# Patient Record
Sex: Female | Born: 2000 | Race: Asian | Hispanic: No | Marital: Single | Smoking: Never smoker
Health system: Southern US, Community
[De-identification: ages and names within clinical notes are randomized; demographics above are authoritative.]

## PROBLEM LIST (undated history)

## (undated) DIAGNOSIS — R51 Headache: Secondary | ICD-10-CM

## (undated) HISTORY — PX: ELBOW ARTHROSCOPY: SUR87

## (undated) HISTORY — DX: Headache: R51

---
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
## 2022-02-01 ENCOUNTER — Ambulatory Visit (INDEPENDENT_AMBULATORY_CARE_PROVIDER_SITE_OTHER): Payer: Medicaid Other | Admitting: Nurse Practitioner

## 2022-02-01 ENCOUNTER — Encounter (HOSPITAL_BASED_OUTPATIENT_CLINIC_OR_DEPARTMENT_OTHER): Payer: Self-pay | Admitting: Nurse Practitioner

## 2022-02-01 VITALS — BP 88/58 | HR 73 | Ht 59.0 in | Wt 98.5 lb

## 2022-02-01 DIAGNOSIS — N281 Cyst of kidney, acquired: Secondary | ICD-10-CM | POA: Diagnosis not present

## 2022-02-01 DIAGNOSIS — E559 Vitamin D deficiency, unspecified: Secondary | ICD-10-CM

## 2022-02-01 DIAGNOSIS — R109 Unspecified abdominal pain: Secondary | ICD-10-CM | POA: Diagnosis not present

## 2022-02-01 NOTE — Progress Notes (Signed)
Orma Render, DNP, AGNP-c Primary Care & Sports Medicine [address]  [place], [place] [address] [phone]  New patient visit   Patient: Alicia Bernard   DOB: [birth year]   21 y.o. Female  MRN: [id number] Visit Date: 02/01/2022  Patient Care Team: Sher Hellinger, Coralee Pesa, NP as PCP - General (Nurse Practitioner) Mir, Gwendolyn Lima, MD as Consulting Physician (Pediatric Gastroenterology)  Today's Vitals   02/01/22 0950  BP: (!) 88/58  Pulse: 73  SpO2: 96%  Weight: 98 lb 8 oz (44.7 kg)  Height: $Remove'4\' 11"'qKrLNxI$  (1.499 m)   Body mass index is 19.89 kg/m.   Today's healthcare provider: Orma Render, NP   Chief Complaint  Patient presents with   New Patient (Initial Visit)    Patient presents today to establish care.   Subjective    Alicia Bernard is a 21 y.o. female who presents today as a new patient to establish care.    Patient endorses the following concerns presently:  Abdominal Pain - This started around the beginning of July.  - She denies fevers, chills, dysuria, loose stools.  - She was seen in the ED and had labs done. She was positive for UTI, but this has been treated - CT was done - She endorses sharp pain in the right abdomen midway between upper and lower quadrant.  - Will lasts for 3 days, in the past would last for a week.  - She tells me the pain comes and goes without any correlation to food or activity.  - She cant think of anything that makes it feel better and nothing that makes it feel worse.  - She tells me that sometimes she feels the pain in her back on the right side.  - She will often feel sick to her stomach like she might throw up.  - She tells me that the first time she had this happen she did not have her period that month.  - She tells me that she did have a day of heavy bleeding last month that lasted for only one day and then stopped.  - She said it was not like a normal period.  - She last had sex in June.  History reviewed  and reveals the following: Past Medical History:  Diagnosis Date   Headache(784.0)    Past Surgical History:  Procedure Laterality Date   ELBOW ARTHROSCOPY     age 54 left elbow surgery from a break   Family Status  Relation Name Status   Mother  Deceased at age 71       Lupus   Father  59   Brother  Alive       Younger   MGM  Alive   MGF  Alive   [place]  Alive   PGF  Alive   Neg Hx  (Not Specified)   Family History  Problem Relation Age of Onset   Lupus Mother    Seizures Mother    Stroke Mother    Hypertension Father    ADD / ADHD Brother    Lung cancer Paternal Grandmother    Irritable bowel syndrome Neg Hx    Food intolerance Neg Hx    GI problems Neg Hx    Social History   Socioeconomic History   Marital status: Single    Spouse name: Not on file   Number of children: Not on file   Years of education: Not on file   Highest education  level: Not on file  Occupational History   Not on file  Tobacco Use   Smoking status: Never   Smokeless tobacco: Never  Substance and Sexual Activity   Alcohol use: Not on file   Drug use: Not on file   Sexual activity: Not on file  Other Topics Concern   Not on file  Social History Narrative   Lives with dad and younger brother. Attends [employer] and is in the 10th grade   Social Determinants of Health   Financial Resource Strain: Not on file  Food Insecurity: Not on file  Transportation Needs: Not on file  Physical Activity: Not on file  Stress: Not on file  Social Connections: Not on file   Outpatient Medications Prior to Visit  Medication Sig   [DISCONTINUED] acetaminophen (TYLENOL) 100 MG/ML solution Take 10 mg/kg by mouth every 4 (four) hours as needed for fever. (Patient not taking: Reported on 02/01/2022)   [DISCONTINUED] b complex vitamins tablet Take 1 tablet by mouth daily. (Patient not taking: Reported on 02/01/2022)   [DISCONTINUED] Coenzyme Q10 100 MG capsule Take 100 mg by mouth daily.  (Patient not taking: Reported on 02/01/2022)   [DISCONTINUED] cyproheptadine (PERIACTIN) 4 MG tablet 4 mg at night and after 2 weeks increase to 4 mg morning at night (Patient not taking: Reported on 02/01/2022)   [DISCONTINUED] esomeprazole (NEXIUM) 20 MG capsule TAKE 1 CAPSULE ONCE DAILY AT LEAST 1 HOUR BEFORE A MEAL SWALLOWING WHOLE. DO NOT CRUSH OR CHEW GRANULES (Patient not taking: Reported on 02/01/2022)   [DISCONTINUED] ibuprofen (ADVIL) 800 MG tablet Take 1 tablet (800 mg total) by mouth 3 (three) times daily. (Patient not taking: Reported on 02/01/2022)   [DISCONTINUED] ondansetron (ZOFRAN) 4 MG tablet Take 1 tablet (4 mg total) by mouth every 6 (six) hours. (Patient not taking: Reported on 02/01/2022)   No facility-administered medications prior to visit.   No Known Allergies Immunization History  Administered Date(s) Administered   Influenza Inj Mdck Quad Pf 03/30/2018   Meningococcal Conjugate 05/10/2020    Review of Systems All review of systems negative except what is listed in the HPI   Objective    BP (!) 88/58   Pulse 73   Ht $R'4\' 11"'jE$  (1.499 m)   Wt 98 lb 8 oz (44.7 kg)   SpO2 96%   BMI 19.89 kg/m  Physical Exam Vitals and nursing note reviewed.  Constitutional:      General: She is not in acute distress.    Appearance: Normal appearance.  Eyes:     Extraocular Movements: Extraocular movements intact.     Conjunctiva/sclera: Conjunctivae normal.     Pupils: Pupils are equal, round, and reactive to light.  Neck:     Vascular: No carotid bruit.  Cardiovascular:     Rate and Rhythm: Normal rate and regular rhythm.     Pulses: Normal pulses.     Heart sounds: Normal heart sounds. No murmur heard. Pulmonary:     Effort: Pulmonary effort is normal.     Breath sounds: Normal breath sounds. No wheezing.  Abdominal:     General: Abdomen is flat. Bowel sounds are normal. There is no distension.     Palpations: Abdomen is soft. There is no mass.     Tenderness: There is  abdominal tenderness in the right lower quadrant and left lower quadrant. There is no right CVA tenderness, left CVA tenderness, guarding or rebound.     Hernia: No hernia is present.  Musculoskeletal:  General: Normal range of motion.     Cervical back: Normal range of motion.     Right lower leg: No edema.     Left lower leg: No edema.  Skin:    General: Skin is warm and dry.     Capillary Refill: Capillary refill takes less than 2 seconds.  Neurological:     General: No focal deficit present.     Mental Status: She is alert and oriented to person, place, and time.  Psychiatric:        Mood and Affect: Mood normal.        Behavior: Behavior normal.        Thought Content: Thought content normal.        Judgment: Judgment normal.     Results for orders placed or performed in visit on 02/01/22  Comprehensive metabolic panel  Result Value Ref Range   Glucose 84 70 - 99 mg/dL   BUN 10 6 - 20 mg/dL   Creatinine, Ser 0.71 0.57 - 1.00 mg/dL   eGFR 126 >59 mL/min/1.73   BUN/Creatinine Ratio 14 9 - 23   Sodium 139 134 - 144 mmol/L   Potassium 4.4 3.5 - 5.2 mmol/L   Chloride 102 96 - 106 mmol/L   CO2 23 20 - 29 mmol/L   Calcium 9.6 8.7 - 10.2 mg/dL   Total Protein 7.4 6.0 - 8.5 g/dL   Albumin 4.6 4.0 - 5.0 g/dL   Globulin, Total 2.8 1.5 - 4.5 g/dL   Albumin/Globulin Ratio 1.6 1.2 - 2.2   Bilirubin Total 0.6 0.0 - 1.2 mg/dL   Alkaline Phosphatase 99 42 - 106 IU/L   AST 16 0 - 40 IU/L   ALT 13 0 - 32 IU/L  CBC With Diff/Platelet  Result Value Ref Range   WBC 9.6 3.4 - 10.8 x10E3/uL   RBC 4.97 3.77 - 5.28 x10E6/uL   Hemoglobin 13.2 11.1 - 15.9 g/dL   Hematocrit 40.2 34.0 - 46.6 %   MCV 81 79 - 97 fL   MCH 26.6 26.6 - 33.0 pg   MCHC 32.8 31.5 - 35.7 g/dL   RDW 12.8 11.7 - 15.4 %   Platelets 369 150 - 450 x10E3/uL   Neutrophils 74 Not Estab. %   Lymphs 22 Not Estab. %   Monocytes 4 Not Estab. %   Eos 0 Not Estab. %   Basos 0 Not Estab. %   Neutrophils Absolute 7.0  1.4 - 7.0 x10E3/uL   Lymphocytes Absolute 2.1 0.7 - 3.1 x10E3/uL   Monocytes Absolute 0.4 0.1 - 0.9 x10E3/uL   EOS (ABSOLUTE) 0.0 0.0 - 0.4 x10E3/uL   Basophils Absolute 0.0 0.0 - 0.2 x10E3/uL   Immature Granulocytes 0 Not Estab. %   Immature Grans (Abs) 0.0 0.0 - 0.1 x10E3/uL  VITAMIN D 25 Hydroxy (Vit-D Deficiency, Fractures)  Result Value Ref Range   Vit D, 25-Hydroxy 12.2 (L) 30.0 - 100.0 ng/mL  Thyroid Panel With TSH  Result Value Ref Range   TSH 0.735 0.450 - 4.500 uIU/mL   T4, Total 8.1 4.5 - 12.0 ug/dL   T3 Uptake Ratio 28 24 - 39 %   Free Thyroxine Index 2.3 1.2 - 4.9  Ct, Ng, Mycoplasmas NAA, Urine  Result Value Ref Range   Mycoplasma genitalium NAA Negative Negative   Mycoplasma hominis NAA Negative Negative   Ureaplasma spp NAA Positive (A) Negative   Chlamydia trachomatis, NAA Negative Negative   Neisseria gonorrhoeae, NAA Negative Negative  Assessment & Plan      Problem List Items Addressed This Visit     Right sided abdominal pain - Primary    Right sided pain for about 2 months with no known etiology. UA in ED positive and treated. CT performed with renal cyst found on right, unclear if this is causing her pain. Will monitor labs and ensure no other concerns are present. Referral to urology. She will follow-up if sx worsen or fail to improve.       Relevant Orders   Comprehensive metabolic panel (Completed)   CBC With Diff/Platelet (Completed)   VITAMIN D 25 Hydroxy (Vit-D Deficiency, Fractures) (Completed)   Thyroid Panel With TSH (Completed)   POCT UA - Microalbumin   Ct, Ng, Mycoplasmas NAA, Urine (Completed)   Ambulatory referral to Urology   Renal cyst, right    CT from ED shows presence of renal cyst. Unclear at this time if this could be contributing to her symptoms. She expresses tenderness in the lower L and R quadrants today. No alarm sx present. Will send referral today for further evaluation given the patients current symptoms.        Relevant Orders   Comprehensive metabolic panel (Completed)   CBC With Diff/Platelet (Completed)   VITAMIN D 25 Hydroxy (Vit-D Deficiency, Fractures) (Completed)   Thyroid Panel With TSH (Completed)   POCT UA - Microalbumin   Ct, Ng, Mycoplasmas NAA, Urine (Completed)   Ambulatory referral to Urology   Vitamin D deficiency   Relevant Medications   Vitamin D, Ergocalciferol, (DRISDOL) 1.25 MG (50000 UNIT) CAPS capsule     Return for next couple of weeks phone call to go over labs and see how feeling.      Ellina Sivertsen, Coralee Pesa, NP, DNP, AGNP-C Primary Care & Sports Medicine at [facility]

## 2022-02-01 NOTE — Patient Instructions (Addendum)
Thank you for choosing [facility] for your Primary Care needs. I am excited for the opportunity to partner with you to meet your health care goals. It was a pleasure meeting you today!  Recommendations from today's visit: The CT scan they did a month ago showed a very small cyst in the right kidney. I would like for you to see the kidney doctor to make sure this is not causing your pain.  I am going to check some labs today to make sure everything is ok. We will check your urine again today.    Information on diet, exercise, and health maintenance recommendations are listed below. This is information to help you be sure you are on track for optimal health and monitoring.   Please look over this and let us know if you have any questions or if you have completed any of the health maintenance outside of [facility] so that we can be sure your records are up to date.  ___________________________________________________________ About Me: I am an Adult-Geriatric Nurse Practitioner with a background in caring for patients for more than 20 years with a strong intensive care background. I provide primary care and sports medicine services to patients age 72 and older within this office. My education had a strong focus on caring for the older adult population, which I am passionate about. I am also the director of the APP Fellowship with [facility].   My desire is to provide you with the best service through preventive medicine and supportive care. I consider you a part of the medical team and value your input. I work diligently to ensure that you are heard and your needs are met in a safe and effective manner. I want you to feel comfortable with me as your provider and want you to know that your health concerns are important to me.  For your information, our office hours are: Monday, Tuesday, and Thursday 8:00 AM - 5:00 PM Wednesday and Friday 8:00 AM - 12:00 PM.   In  my time away from the office I am teaching new APP's within the system and am unavailable, but my partner, Dr. Burnard Bunting is in the office for emergent needs.   If you have questions or concerns, please call our office at [phone] or send [place] a MyChart message and we will respond as quickly as possible.  ____________________________________________________________ MyChart:  For all urgent or time sensitive needs we ask that you please call the office to avoid delays. Our number is [phone]. MyChart is not constantly monitored and due to the large volume of messages a day, replies may take up to 72 business hours.  MyChart Policy: MyChart allows for you to see your visit notes, after visit summary, provider recommendations, lab and tests results, make an appointment, request refills, and contact your provider or the office for non-urgent questions or concerns. Providers are seeing patients during normal business hours and do not have built in time to review MyChart messages.  We ask that you allow a minimum of 3 business days for responses to [employer]. For this reason, please do not send urgent requests through [place]. Please call the office at [phone]. New and ongoing conditions may require a visit. We have virtual and in person visit available for your convenience.  Complex MyChart concerns may require a visit. Your provider may request you schedule a virtual or in person visit to ensure we are providing the best care possible. MyChart messages  sent after 11:00 AM on Friday will not be received by the provider until Monday morning.    Lab and Test Results: You will receive your lab and test results on MyChart as soon as they are completed and results have been sent by the lab or testing facility. Due to this service, you will receive your results BEFORE your provider.  I review lab and tests results each morning prior to seeing patients. Some results require collaboration with  other providers to ensure you are receiving the most appropriate care. For this reason, we ask that you please allow a minimum of 3-5 business days from the time the ALL results have been received for your provider to receive and review lab and test results and contact you about these.  Most lab and test result comments from the provider will be sent through [place]. Your provider may recommend changes to the plan of care, follow-up visits, repeat testing, ask questions, or request an office visit to discuss these results. You may reply directly to this message or call the office at [phone] to provide information for the provider or set up an appointment. In some instances, you will be called with test results and recommendations. Please let us know if this is preferred and we will make note of this in your chart to provide this for you.    If you have not heard a response to your lab or test results in 5 business days from all results returning to [place], please call the office to let us know. We ask that you please avoid calling prior to this time unless there is an emergent concern. Due to high call volumes, this can delay the resulting process.  After Hours: For all non-emergency after hours needs, please call the office at [phone] and select the option to reach the on-call provider service. On-call services are shared between multiple [facility] offices and therefore it will not be possible to speak directly with your provider. On-call providers may provide medical advice and recommendations, but are unable to provide refills for maintenance medications.  For all emergency or urgent medical needs after normal business hours, we recommend that you seek care at the closest Urgent Care or Emergency Department to ensure appropriate treatment in a timely manner.  [facility] has a 24 hour emergency room located on the ground floor for your convenience.   Urgent Concerns  During the Business Day Providers are seeing patients from 8AM to [place] with a busy schedule and are most often not able to respond to non-urgent calls until the end of the day or the next business day. If you should have URGENT concerns during the day, please call and speak to the nurse or schedule a same day appointment so that we can address your concern without delay.   Thank you, again, for choosing me as your health care partner. I appreciate your trust and look forward to learning more about you.   Worthy Keeler, DNP, AGNP-c ___________________________________________________________  Health Maintenance Recommendations Screening Testing Mammogram Every 1 -2 years based on history and risk factors Starting at age 43 Pap Smear Ages 21-39 every 3 years Ages 79-65 every 5 years with HPV testing More frequent testing may be required based on results and history Colon Cancer Screening Every 1-10 years based on test performed, risk factors, and history Starting at age 35 Bone Density Screening Every 2-10 years based on history Starting at age 67 for women Recommendations for men differ  based on medication usage, history, and risk factors AAA Screening One time ultrasound Men 30-44 years old who have every smoked Lung Cancer Screening Low Dose Lung CT every 12 months Age 29-80 years with a 30 pack-year smoking history who still smoke or who have quit within the last 15 years  Screening Labs Routine  Labs: Complete Blood Count (CBC), Complete Metabolic Panel (CMP), Cholesterol (Lipid Panel) Every 6-12 months based on history and medications May be recommended more frequently based on current conditions or previous results Hemoglobin A1c Lab Every 3-12 months based on history and previous results Starting at age 26 or earlier with diagnosis of diabetes, high cholesterol, BMI >26, and/or risk factors Frequent monitoring for patients with diabetes to ensure blood sugar  control Thyroid Panel (TSH w/ T3 & T4) Every 6 months based on history, symptoms, and risk factors May be repeated more often if on medication HIV One time testing for all patients 15 and older May be repeated more frequently for patients with increased risk factors or exposure Hepatitis C One time testing for all patients 42 and older May be repeated more frequently for patients with increased risk factors or exposure Gonorrhea, Chlamydia Every 12 months for all sexually active persons 13-24 years Additional monitoring may be recommended for those who are considered high risk or who have symptoms PSA Men 76-59 years old with risk factors Additional screening may be recommended from age 56-69 based on risk factors, symptoms, and history  Vaccine Recommendations Tetanus Booster All adults every 10 years Flu Vaccine All patients 6 months and older every year COVID Vaccine All patients 12 years and older Initial dosing with booster May recommend additional booster based on age and health history HPV Vaccine 2 doses all patients age 31-26 Dosing may be considered for patients over 26 Shingles Vaccine (Shingrix) 2 doses all adults 50 years and older Pneumonia (Pneumovax 23) All adults 16 years and older May recommend earlier dosing based on health history Pneumonia (Prevnar 20) All adults 64 years and older Dosed 1 year after Pneumovax 23  Additional Screening, Testing, and Vaccinations may be recommended on an individualized basis based on family history, health history, risk factors, and/or exposure.  __________________________________________________________  Diet Recommendations for All Patients  I recommend that all patients maintain a diet low in saturated fats, carbohydrates, and cholesterol. While this can be challenging at first, it is not impossible and small changes can make big differences.  Things to try: Decreasing the amount of soda, sweet tea, and/or juice to  one or less per day and replace with water While water is always the first choice, if you do not like water you may consider adding a water additive without sugar to improve the taste other sugar free drinks Replace potatoes with a brightly colored vegetable at dinner Use healthy oils, such as canola oil or olive oil, instead of butter or hard margarine Limit your bread intake to two pieces or less a day Replace regular pasta with low carb pasta options Bake, broil, or grill foods instead of frying Monitor portion sizes  Eat smaller, more frequent meals throughout the day instead of large meals  An important thing to remember is, if you love foods that are not great for your health, you don't have to give them up completely. Instead, allow these foods to be a reward when you have done well. Allowing yourself to still have special treats every once in a while is a nice way to tell yourself thank  you for working hard to keep yourself healthy.   Also remember that every day is a new day. If you have a bad day and "fall off the wagon", you can still climb right back up and keep moving along on your journey!  We have resources available to help you!  Some websites that may be helpful include: www.http://carter.biz/  Www.VeryWellFit.com _____________________________________________________________  Activity Recommendations for All Patients  I recommend that all adults get at least 20 minutes of moderate physical activity that elevates your heart rate at least 5 days out of the week.  Some examples include: Walking or jogging at a pace that allows you to carry on a conversation Cycling (stationary bike or outdoors) Water aerobics Yoga Weight lifting Dancing If physical limitations prevent you from putting stress on your joints, exercise in a pool or seated in a chair are excellent options.  Do determine your MAXIMUM heart rate for activity: YOUR AGE - 220 = MAX HeartRate   Remember! Do not  push yourself too hard.  Start slowly and build up your pace, speed, weight, time in exercise, etc.  Allow your body to rest between exercise and get good sleep. You will need more water than normal when you are exerting yourself. Do not wait until you are thirsty to drink. Drink with a purpose of getting in at least 8, 8 ounce glasses of water a day plus more depending on how much you exercise and sweat.    If you begin to develop dizziness, chest pain, abdominal pain, jaw pain, shortness of breath, headache, vision changes, lightheadedness, or other concerning symptoms, stop the activity and allow your body to rest. If your symptoms are severe, seek emergency evaluation immediately. If your symptoms are concerning, but not severe, please let us know so that we can recommend further evaluation.

## 2022-02-02 LAB — COMPREHENSIVE METABOLIC PANEL
ALT: 13 IU/L (ref 0–32)
AST: 16 IU/L (ref 0–40)
Albumin/Globulin Ratio: 1.6 (ref 1.2–2.2)
Albumin: 4.6 g/dL (ref 4.0–5.0)
Alkaline Phosphatase: 99 IU/L (ref 42–106)
BUN/Creatinine Ratio: 14 (ref 9–23)
BUN: 10 mg/dL (ref 6–20)
Bilirubin Total: 0.6 mg/dL (ref 0.0–1.2)
CO2: 23 mmol/L (ref 20–29)
Calcium: 9.6 mg/dL (ref 8.7–10.2)
Chloride: 102 mmol/L (ref 96–106)
Creatinine, Ser: 0.71 mg/dL (ref 0.57–1.00)
Globulin, Total: 2.8 g/dL (ref 1.5–4.5)
Glucose: 84 mg/dL (ref 70–99)
Potassium: 4.4 mmol/L (ref 3.5–5.2)
Sodium: 139 mmol/L (ref 134–144)
Total Protein: 7.4 g/dL (ref 6.0–8.5)
eGFR: 126 mL/min/{1.73_m2} (ref >59)

## 2022-02-02 LAB — CBC WITH DIFF/PLATELET
Basophils Absolute: 0 10*3/uL (ref 0.0–0.2)
Basos: 0 %
EOS (ABSOLUTE): 0 10*3/uL (ref 0.0–0.4)
Eos: 0 %
Hematocrit: 40.2 % (ref 34.0–46.6)
Hemoglobin: 13.2 g/dL (ref 11.1–15.9)
Immature Grans (Abs): 0 10*3/uL (ref 0.0–0.1)
Immature Granulocytes: 0 %
Lymphocytes Absolute: 2.1 10*3/uL (ref 0.7–3.1)
Lymphs: 22 %
MCH: 26.6 pg (ref 26.6–33.0)
MCHC: 32.8 g/dL (ref 31.5–35.7)
MCV: 81 fL (ref 79–97)
Monocytes Absolute: 0.4 10*3/uL (ref 0.1–0.9)
Monocytes: 4 %
Neutrophils Absolute: 7 10*3/uL (ref 1.4–7.0)
Neutrophils: 74 %
Platelets: 369 10*3/uL (ref 150–450)
RBC: 4.97 x10E6/uL (ref 3.77–5.28)
RDW: 12.8 % (ref 11.7–15.4)
WBC: 9.6 10*3/uL (ref 3.4–10.8)

## 2022-02-02 LAB — THYROID PANEL WITH TSH
Free Thyroxine Index: 2.3 (ref 1.2–4.9)
T3 Uptake Ratio: 28 % (ref 24–39)
T4, Total: 8.1 ug/dL (ref 4.5–12.0)
TSH: 0.735 u[IU]/mL (ref 0.450–4.500)

## 2022-02-02 LAB — VITAMIN D 25 HYDROXY (VIT D DEFICIENCY, FRACTURES): Vit D, 25-Hydroxy: 12.2 ng/mL — ABNORMAL LOW (ref 30.0–100.0)

## 2022-02-05 LAB — CT, NG, MYCOPLASMAS NAA, URINE
Chlamydia trachomatis, NAA: NEGATIVE
Mycoplasma genitalium NAA: NEGATIVE
Mycoplasma hominis NAA: NEGATIVE
Neisseria gonorrhoeae, NAA: NEGATIVE
Ureaplasma spp NAA: POSITIVE — AB

## 2022-02-05 MED ORDER — VITAMIN D (ERGOCALCIFEROL) 1.25 MG (50000 UNIT) PO CAPS: 50000.0000 [IU] | ORAL_CAPSULE | ORAL | 1 refills | Status: DC

## 2022-02-06 ENCOUNTER — Encounter (HOSPITAL_BASED_OUTPATIENT_CLINIC_OR_DEPARTMENT_OTHER): Payer: Self-pay | Admitting: Nurse Practitioner

## 2022-02-06 ENCOUNTER — Other Ambulatory Visit (HOSPITAL_BASED_OUTPATIENT_CLINIC_OR_DEPARTMENT_OTHER): Payer: Self-pay | Admitting: Nurse Practitioner

## 2022-02-06 DIAGNOSIS — N341 Nonspecific urethritis: Secondary | ICD-10-CM

## 2022-02-06 MED ORDER — DOXYCYCLINE HYCLATE 100 MG PO TABS: 100.0000 mg | ORAL_TABLET | Freq: Two times a day (BID) | ORAL | 0 refills | Status: DC

## 2022-02-06 NOTE — Progress Notes (Signed)
LVM on 8/29 @ 8:18 to sch-repeat vitamin d lab only in 6 months.

## 2022-02-11 ENCOUNTER — Encounter (HOSPITAL_BASED_OUTPATIENT_CLINIC_OR_DEPARTMENT_OTHER): Payer: Self-pay | Admitting: Nurse Practitioner

## 2022-02-25 ENCOUNTER — Ambulatory Visit (INDEPENDENT_AMBULATORY_CARE_PROVIDER_SITE_OTHER): Payer: Medicaid Other | Admitting: Nurse Practitioner

## 2022-02-25 DIAGNOSIS — R109 Unspecified abdominal pain: Secondary | ICD-10-CM | POA: Diagnosis not present

## 2022-02-25 DIAGNOSIS — N281 Cyst of kidney, acquired: Secondary | ICD-10-CM | POA: Diagnosis not present

## 2022-02-25 NOTE — Patient Instructions (Signed)
I have sent the referral to Urology for evaluation. They should call you to schedule. I am sending this for the right sided pain and the cyst in the kidney that was seen on the CT scan.   Please let me know if you havent heard anything by the end of the week.

## 2022-02-25 NOTE — Progress Notes (Signed)
Virtual Visit Encounter telephone visit.   I connected with  Alicia Bernard on 03/09/22 at  3:30 PM EDT by secure audio telemedicine application. I verified that I am speaking with the correct person using two identifiers.   I introduced myself as a Designer, jewellery with the practice. The limitations of evaluation and management by telemedicine discussed with the patient and the availability of in person appointments. The patient expressed verbal understanding and consent to proceed.  Participating parties in this visit include: Myself and patient  The patient is: Patient Location: Home I am: Provider Location: Office/Clinic Subjective:    CC and HPI: Alicia Bernard is a 21 y.o. year old female presenting for review of labs Patient reports the following: Labs reviewed with patient today and in depth discussion over findings and recommendations provided. She requests a referral to urology for evaluation of the cysts noted on recent imaging on her kidney. She is not currently having urinary symptoms, but is continuing to have abdominal/flank pain intermittently.    Past medical history, Surgical history, Family history not pertinant except as noted below, Social history, Allergies, and medications have been entered into the medical record, reviewed, and corrections made.   Review of Systems:  All review of systems negative except what is listed in the HPI  Objective:    Alert and oriented x 4 Speaking in clear sentences with no shortness of breath. No distress.  Impression and Recommendations:    Problem List Items Addressed This Visit     Renal cyst, right - Primary    Discussion of her recent lab results with patient and explanation.  She expresses understanding.  We will go ahead and send a referral to urology for the presence of renal cyst that was noted on recent imaging.  No alarm symptoms present at this time.  Recommend follow-up if new symptoms develop.      Relevant Orders    Ambulatory referral to Urology   Right sided abdominal pain   Relevant Orders   Ambulatory referral to Urology    orders and follow up as documented in EMR I discussed the assessment and treatment plan with the patient. The patient was provided an opportunity to ask questions and all were answered. The patient agreed with the plan and demonstrated an understanding of the instructions.   The patient was advised to call back or seek an in-person evaluation if the symptoms worsen or if the condition fails to improve as anticipated.  Follow-Up: prn  I provided 12 minutes of non-face-to-face interaction with this non face-to-face encounter including intake, same-day documentation, and chart review.   Orma Render, NP , DNP, AGNP-c [facility] [phone] (fax)

## 2022-02-25 NOTE — Progress Notes (Deleted)
  HPI and ROS  Alicia Bernard presents today for Nurse Visit for evaluation of ***. Current symptoms include: *** ***  Symptoms first started ***. Symptoms have been {Headache worse:10706::"getting worse"} since they started Pain is {PAIN DESCRIPTION:21022940} and {NUMBERS; 1-10 (OUT OF 10):10902} today. Symptoms improve with *** Symptoms worsen with *** she {Otc med recent hx:60548}  Similar symptoms {ACTIONS; HAVE/HAVE JAS:50539} occurred in the past Previous diagnosis was ***   ASSESSMENT and PLAN Patient appears {EXAM; GENERAL APPEARANCE:5021}  Orders placed under Standing Orders protocol and/or by direct guidance by provider: No orders of the defined types were placed in this encounter.   Patient provided with education and instructions on follow-up, result retrieval, and condition specific information.   All findings and Information obtained in visit routed to {PCD Providers:27495} Orma Render, NP 02/25/2022

## 2022-03-01 ENCOUNTER — Encounter (HOSPITAL_BASED_OUTPATIENT_CLINIC_OR_DEPARTMENT_OTHER): Payer: Self-pay | Admitting: Nurse Practitioner

## 2022-03-06 DIAGNOSIS — E559 Vitamin D deficiency, unspecified: Secondary | ICD-10-CM | POA: Insufficient documentation

## 2022-03-06 DIAGNOSIS — N281 Cyst of kidney, acquired: Secondary | ICD-10-CM | POA: Insufficient documentation

## 2022-03-06 DIAGNOSIS — R109 Unspecified abdominal pain: Secondary | ICD-10-CM | POA: Insufficient documentation

## 2022-03-06 NOTE — Assessment & Plan Note (Signed)
CT from ED shows presence of renal cyst. Unclear at this time if this could be contributing to her symptoms. She expresses tenderness in the lower L and R quadrants today. No alarm sx present. Will send referral today for further evaluation given the patients current symptoms.

## 2022-03-06 NOTE — Assessment & Plan Note (Signed)
Right sided pain for about 2 months with no known etiology. UA in ED positive and treated. CT performed with renal cyst found on right, unclear if this is causing her pain. Will monitor labs and ensure no other concerns are present. Referral to urology. She will follow-up if sx worsen or fail to improve.

## 2022-03-09 ENCOUNTER — Encounter (HOSPITAL_BASED_OUTPATIENT_CLINIC_OR_DEPARTMENT_OTHER): Payer: Self-pay | Admitting: Nurse Practitioner

## 2022-03-09 NOTE — Assessment & Plan Note (Signed)
Discussion of her recent lab results with patient and explanation.  She expresses understanding.  We will go ahead and send a referral to urology for the presence of renal cyst that was noted on recent imaging.  No alarm symptoms present at this time.  Recommend follow-up if new symptoms develop.

## 2022-03-19 ENCOUNTER — Encounter (HOSPITAL_BASED_OUTPATIENT_CLINIC_OR_DEPARTMENT_OTHER): Payer: Self-pay | Admitting: Nurse Practitioner

## 2022-04-10 ENCOUNTER — Other Ambulatory Visit (HOSPITAL_BASED_OUTPATIENT_CLINIC_OR_DEPARTMENT_OTHER): Payer: Self-pay | Admitting: Nurse Practitioner

## 2022-04-10 DIAGNOSIS — E559 Vitamin D deficiency, unspecified: Secondary | ICD-10-CM

## 2022-04-18 ENCOUNTER — Encounter (HOSPITAL_BASED_OUTPATIENT_CLINIC_OR_DEPARTMENT_OTHER): Payer: Self-pay | Admitting: Nurse Practitioner

## 2022-05-20 ENCOUNTER — Encounter: Payer: Self-pay | Admitting: Nurse Practitioner

## 2022-05-20 DIAGNOSIS — N926 Irregular menstruation, unspecified: Secondary | ICD-10-CM

## 2022-06-19 ENCOUNTER — Telehealth: Payer: Self-pay

## 2022-06-19 NOTE — Telephone Encounter (Signed)
Left message for pt to call office back regarding referral that was sent to [facility].  

## 2022-07-02 ENCOUNTER — Encounter: Payer: Self-pay | Admitting: Nurse Practitioner

## 2022-07-17 ENCOUNTER — Encounter: Payer: Self-pay | Admitting: Nurse Practitioner

## 2022-09-13 ENCOUNTER — Ambulatory Visit (INDEPENDENT_AMBULATORY_CARE_PROVIDER_SITE_OTHER): Payer: Medicaid Other | Admitting: Family Medicine

## 2022-09-13 ENCOUNTER — Encounter: Payer: Self-pay | Admitting: Family Medicine

## 2022-09-13 VITALS — BP 116/83 | HR 87 | Wt 95.8 lb

## 2022-09-13 DIAGNOSIS — E559 Vitamin D deficiency, unspecified: Secondary | ICD-10-CM

## 2022-09-13 DIAGNOSIS — N926 Irregular menstruation, unspecified: Secondary | ICD-10-CM | POA: Diagnosis not present

## 2022-09-13 NOTE — Progress Notes (Signed)
CC: Pt stating that her period has always been irregular   Pt stating that she is not currently sexually active.

## 2022-09-13 NOTE — Progress Notes (Signed)
   GYNECOLOGY PROBLEM  VISIT ENCOUNTER NOTE  Subjective:   Alicia Bernard is a 22 y.o. G0P0000 female here for a problem GYN visit.  Current complaints: irregular periods.     Patient reports that since starting her menses at 13-14 that her cycles will be regular/monthly for several months and then will skip months and then become regular again. She is not sexually active and last sexual contact was months ago.  She reports she had a period in March 2024 but not in Feb but did in Jan. Her cycles are 3-4 days with minimal cramping.   Also reports 1-3 times in the last year that she had has spotting but not cycle. She was screened for GC/CT at [facility] within that last year. She has not had any new partners since her testing.   Denies abnormal vaginal bleeding, discharge, pelvic pain, problems with intercourse or other gynecologic concerns.    Gynecologic History No LMP recorded. (Menstrual status: Irregular Periods).  Contraception: none  Health Maintenance Due  Topic Date Due   COVID-19 Vaccine (1) Never done   HPV VACCINES (1 - 2-dose series) Never done   HIV Screening  Never done   Hepatitis C Screening  Never done   DTaP/Tdap/Td (1 - Tdap) Never done    The following portions of the patient's history were reviewed and updated as appropriate: allergies, current medications, past family history, past medical history, past social history, past surgical history and problem list.  Review of Systems Pertinent items are noted in HPI.   Objective:  BP 116/83   Pulse 87   Wt 95 lb 12.8 oz (43.5 kg)   BMI 19.35 kg/m  Gen: well appearing, NAD HEENT: no scleral icterus. Thyroid is not enlarged or tender.  CV: RR Lung: Normal WOB Abd: Thin, non-tender, nondistended. No masses. No HSM Ext: warm well perfused 2+ pulses in radial and pedal Skin: Acne on face.      Assessment and Plan:   1. Irregular periods/menstrual cycles Reviewed metabolic causes and PCOS Discussed  cycle control  Reviewed stimulating bleeding at 3 mon of amenorrhea.  - TSH + free T4 - Hemoglobin A1c - TestT+TestF+SHBG - US PELVIC COMPLETE WITH TRANSVAGINAL; Future - VITAMIN D 25 Hydroxy (Vit-D Deficiency, Fractures)  Please refer to After Visit Summary for other counseling recommendations.   Return in about 3 months (around 12/13/2022).  Federico Flake, MD, MPH, ABFM Attending Physician Faculty Practice- [facility]

## 2022-09-18 ENCOUNTER — Ambulatory Visit (HOSPITAL_COMMUNITY)
Admission: RE | Admit: 2022-09-18 | Discharge: 2022-09-18 | Disposition: A | Discharge status: Home / Self Care | Payer: Medicaid Other | Source: Ambulatory Visit | Attending: Family Medicine | Admitting: Family Medicine

## 2022-09-18 DIAGNOSIS — N926 Irregular menstruation, unspecified: Secondary | ICD-10-CM

## 2022-09-18 LAB — TESTT+TESTF+SHBG
Sex Hormone Binding: 39.8 nmol/L (ref 24.6–122.0)
Testosterone, Free: 2.4 pg/mL
Testosterone, Total, LC/MS: 26.3 ng/dL (ref 10.0–55.0)

## 2022-09-18 LAB — VITAMIN D 25 HYDROXY (VIT D DEFICIENCY, FRACTURES): Vit D, 25-Hydroxy: 19.6 ng/mL — ABNORMAL LOW (ref 30.0–100.0)

## 2022-09-18 LAB — HEMOGLOBIN A1C
Est. average glucose Bld gHb Est-mCnc: 100 mg/dL
Hgb A1c MFr Bld: 5.1 % (ref 4.8–5.6)

## 2022-09-18 LAB — TSH+FREE T4
Free T4: 1.26 ng/dL (ref 0.93–1.60)
TSH: 1.23 u[IU]/mL (ref 0.450–4.500)

## 2022-09-23 MED ORDER — VITAMIN D (ERGOCALCIFEROL) 1.25 MG (50000 UNIT) PO CAPS: 50000.0000 [IU] | ORAL_CAPSULE | ORAL | 0 refills | Status: DC

## 2022-09-23 NOTE — Addendum Note (Signed)
Addended by: Geanie Berlin on: 09/23/2022 08:33 AM   Modules accepted: Orders

## 2022-10-22 ENCOUNTER — Emergency Department (HOSPITAL_COMMUNITY)
Admission: EM | Admit: 2022-10-22 | Discharge: 2022-10-23 | Disposition: A | Discharge status: Home / Self Care | Payer: Medicaid Other | Attending: Emergency Medicine | Admitting: Emergency Medicine

## 2022-10-22 ENCOUNTER — Ambulatory Visit (HOSPITAL_COMMUNITY)
Admission: EM | Admit: 2022-10-22 | Discharge: 2022-10-22 | Disposition: A | Discharge status: Other Acute Inpatient Hospital | Payer: Medicaid Other

## 2022-10-22 ENCOUNTER — Encounter (HOSPITAL_COMMUNITY): Payer: Self-pay

## 2022-10-22 ENCOUNTER — Other Ambulatory Visit: Payer: Self-pay

## 2022-10-22 DIAGNOSIS — R000 Tachycardia, unspecified: Secondary | ICD-10-CM | POA: Insufficient documentation

## 2022-10-22 DIAGNOSIS — B349 Viral infection, unspecified: Secondary | ICD-10-CM | POA: Insufficient documentation

## 2022-10-22 DIAGNOSIS — J029 Acute pharyngitis, unspecified: Secondary | ICD-10-CM | POA: Diagnosis present

## 2022-10-22 LAB — URINALYSIS, ROUTINE W REFLEX MICROSCOPIC
Bilirubin Urine: NEGATIVE
Glucose, UA: NEGATIVE mg/dL
Hgb urine dipstick: NEGATIVE
Ketones, ur: NEGATIVE mg/dL
Nitrite: NEGATIVE
Protein, ur: NEGATIVE mg/dL
Specific Gravity, Urine: 1.01 (ref 1.005–1.030)
pH: 5 (ref 5.0–8.0)

## 2022-10-22 LAB — CBC WITH DIFFERENTIAL/PLATELET
Abs Immature Granulocytes: 0.04 10*3/uL (ref 0.00–0.07)
Basophils Absolute: 0 10*3/uL (ref 0.0–0.1)
Basophils Relative: 0 %
Eosinophils Absolute: 0.1 10*3/uL (ref 0.0–0.5)
Eosinophils Relative: 1 %
HCT: 40 % (ref 36.0–46.0)
Hemoglobin: 12.9 g/dL (ref 12.0–15.0)
Immature Granulocytes: 0 %
Lymphocytes Relative: 10 %
Lymphs Abs: 1.1 10*3/uL (ref 0.7–4.0)
MCH: 25.9 pg — ABNORMAL LOW (ref 26.0–34.0)
MCHC: 32.3 g/dL (ref 30.0–36.0)
MCV: 80.3 fL (ref 80.0–100.0)
Monocytes Absolute: 0.6 10*3/uL (ref 0.1–1.0)
Monocytes Relative: 5 %
Neutro Abs: 9 10*3/uL — ABNORMAL HIGH (ref 1.7–7.7)
Neutrophils Relative %: 84 %
Platelets: 390 10*3/uL (ref 150–400)
RBC: 4.98 MIL/uL (ref 3.87–5.11)
RDW: 12.7 % (ref 11.5–15.5)
WBC: 10.8 10*3/uL — ABNORMAL HIGH (ref 4.0–10.5)
nRBC: 0 % (ref 0.0–0.2)

## 2022-10-22 LAB — GROUP A STREP BY PCR: Group A Strep by PCR: NOT DETECTED

## 2022-10-22 LAB — COMPREHENSIVE METABOLIC PANEL
ALT: 16 U/L (ref 0–44)
AST: 18 U/L (ref 15–41)
Albumin: 4 g/dL (ref 3.5–5.0)
Alkaline Phosphatase: 73 U/L (ref 38–126)
Anion gap: 8 (ref 5–15)
BUN: 9 mg/dL (ref 6–20)
CO2: 24 mmol/L (ref 22–32)
Calcium: 9 mg/dL (ref 8.9–10.3)
Chloride: 104 mmol/L (ref 98–111)
Creatinine, Ser: 0.77 mg/dL (ref 0.44–1.00)
GFR, Estimated: >60 mL/min (ref >60)
Glucose, Bld: 80 mg/dL (ref 70–99)
Potassium: 3.5 mmol/L (ref 3.5–5.1)
Sodium: 136 mmol/L (ref 135–145)
Total Bilirubin: 0.6 mg/dL (ref 0.3–1.2)
Total Protein: 7.5 g/dL (ref 6.5–8.1)

## 2022-10-22 LAB — I-STAT BETA HCG BLOOD, ED (MC, WL, AP ONLY): I-stat hCG, quantitative: <5 m[IU]/mL (ref <5)

## 2022-10-22 LAB — LIPASE, BLOOD: Lipase: 36 U/L (ref 11–51)

## 2022-10-22 MED ORDER — SODIUM CHLORIDE 0.9 % IV BOLUS
1000.0000 mL | Freq: Once | INTRAVENOUS | Status: AC
  Administered 2022-10-22: 1000 mL via INTRAVENOUS

## 2022-10-22 MED ORDER — KETOROLAC TROMETHAMINE 15 MG/ML IJ SOLN
15.0000 mg | Freq: Once | INTRAMUSCULAR | Status: AC
  Administered 2022-10-22: 15 mg via INTRAVENOUS
  Filled 2022-10-22: qty 1

## 2022-10-22 MED ORDER — ONDANSETRON HCL 4 MG/2ML IJ SOLN
4.0000 mg | Freq: Once | INTRAMUSCULAR | Status: AC
  Administered 2022-10-23: 4 mg via INTRAVENOUS
  Filled 2022-10-22: qty 2

## 2022-10-22 NOTE — ED Provider Notes (Signed)
Seen briefly in triage Yesterday started with epigastric abdominal pain that felt like burning.  Also having headache, feeling lightheaded and dizzy. Today had 3 episodes of hematemesis Reports the first was a light pink color.  Then she had dark brown/black "clumpy" looking emesis.  The third episode about an hour ago was bright red blood. She is still having pain in the abdomen. Denies history of this  Sent to ED for higher level of care, advanced imaging and monitoring.   Kathrine Haddock 10/22/22 1810

## 2022-10-22 NOTE — ED Triage Notes (Signed)
Sore throat onset yesterday and worse today. Patient states she had bloody emesis 3 times today. Patient states first emesis was light pink and then was dark red. States the 3rd emesis was like coffee grounds.

## 2022-10-22 NOTE — ED Notes (Signed)
Patient is being discharged from the Urgent Care and sent to the Emergency Department via POV . Per [facility], patient is in need of higher level of care due to Hematemesis. Patient is aware and verbalizes understanding of plan of care.  Vitals:   10/22/22 1758  BP: 126/62  Pulse: 93  Resp: 18  Temp: 98.5 F (36.9 C)  SpO2: 100%

## 2022-10-22 NOTE — ED Triage Notes (Signed)
Pt arrives with c/o headache and bloody emesis. Per pt, headache started 2 days ago and the vomiting started today. Per pt, she had 3 episodes. Pt denies SOB or CP. Pt endorses ABD pain.

## 2022-10-22 NOTE — ED Provider Notes (Signed)
[place] EMERGENCY DEPARTMENT AT [facility] Provider Note   CSN: [id number] Arrival date & time: 10/22/22  1817     History  Chief Complaint  Patient presents with   Headache    Alicia Bernard is a 22 y.o. female.  22 year old female presents to the emergency department for evaluation of URI symptoms.  She began feeling ill 2 days ago, reporting sore throat, body aches, lightheadedness/dizziness.  Her symptoms have persisted and now involve a global headache today as well as abdominal cramping, nausea. She has 3 episodes of emesis streaked with blood, "pink and brown color". Reports this spontaneously resolved as subsequent emesis has been nonbloody. No medications taken PTA for symptoms. Denies fevers, melena, hematochezia, urinary symptoms, CP, SOB. Denies sick contacts. No prior abdominal surgeries.   The history is provided by the patient. No language interpreter was used.  Headache      Home Medications Prior to Admission medications   Medication Sig Start Date End Date Taking? Authorizing Provider  doxycycline (VIBRA-TABS) 100 MG tablet Take 1 tablet (100 mg total) by mouth 2 (two) times daily. Be sure to complete entire course of antibiotics. Patient not taking: Reported on 09/13/2022 02/06/22   Early, Sung Amabile, NP  Vitamin D, Ergocalciferol, (DRISDOL) 1.25 MG (50000 UNIT) CAPS capsule Take 1 capsule (50,000 Units total) by mouth every 7 (seven) days. 09/23/22   Federico Flake, MD      Allergies    Patient has no known allergies.    Review of Systems   Review of Systems  Neurological:  Positive for headaches.  Ten systems reviewed and are negative for acute change, except as noted in the HPI.    Physical Exam Updated Vital Signs BP 119/75 (BP Location: Right Arm)   Pulse 98   Temp 98.4 F (36.9 C) (Oral)   Resp 16   Wt 45.4 kg   LMP 10/08/2022 (Approximate)   SpO2 99%   BMI 20.20 kg/m   Physical Exam Vitals and nursing note reviewed.   Constitutional:      General: She is not in acute distress.    Appearance: She is well-developed. She is not diaphoretic.     Comments: Nontoxic appearing and in NAD  HENT:     Head: Normocephalic and atraumatic.     Mouth/Throat:     Comments: Mild posterior oropharyngeal erythema without exudates, edema. Normal phonation. Eyes:     General: No scleral icterus.    Conjunctiva/sclera: Conjunctivae normal.  Cardiovascular:     Rate and Rhythm: Normal rate and regular rhythm.     Pulses: Normal pulses.  Pulmonary:     Effort: Pulmonary effort is normal. No respiratory distress.     Comments: Respirations even and unlabored. Lungs CTAB. Musculoskeletal:        General: Normal range of motion.     Cervical back: Normal range of motion.  Skin:    General: Skin is warm and dry.     Coloration: Skin is not pale.     Findings: No erythema or rash.  Neurological:     Mental Status: She is alert and oriented to person, place, and time.     Coordination: Coordination normal.  Psychiatric:        Behavior: Behavior normal.     ED Results / Procedures / Treatments   Labs (all labs ordered are listed, but only abnormal results are displayed) Labs Reviewed  CBC WITH DIFFERENTIAL/PLATELET - Abnormal; Notable for the following components:  Result Value   WBC 10.8 (*)    MCH 25.9 (*)    Neutro Abs 9.0 (*)    All other components within normal limits  URINALYSIS, ROUTINE W REFLEX MICROSCOPIC - Abnormal; Notable for the following components:   APPearance HAZY (*)    Leukocytes,Ua SMALL (*)    Bacteria, UA RARE (*)    All other components within normal limits  GROUP A STREP BY PCR  COMPREHENSIVE METABOLIC PANEL  LIPASE, BLOOD  I-STAT BETA HCG BLOOD, ED ([facility], [facility], [facility] ONLY)    EKG EKG Interpretation  Date/Time:  Tuesday Oct 22 2022 23:03:06 EDT Ventricular Rate:  102 PR Interval:  132 QRS Duration: 82 QT Interval:  324 QTC Calculation: 422 R Axis:   77 Text  Interpretation: Sinus tachycardia Otherwise normal ECG No previous ECGs available No previous ECGs available Confirmed by Glynn Octave [phone]) on 10/22/2022 11:10:17 PM  Radiology No results found.  Procedures Procedures    Medications Ordered in ED Medications  ondansetron (ZOFRAN) injection 4 mg (has no administration in time range)  ketorolac (TORADOL) 15 MG/ML injection 15 mg (15 mg Intravenous Given 10/22/22 2240)  sodium chloride 0.9 % bolus 1,000 mL (1,000 mLs Intravenous New Bag/Given 10/22/22 2235)    ED Course/ Medical Decision Making/ A&P Clinical Course as of 10/31/22 1718  Wed Oct 23, 2022  0126 Patient feeling better. Tolerating PO fluids. No longer lightheaded. [KH]    Clinical Course User Index [KH] Antony Madura, PA-C                             Medical Decision Making Amount and/or Complexity of Data Reviewed Labs: ordered. ECG/medicine tests: ordered.  Risk OTC drugs. Prescription drug management.   This patient presents to the ED for concern of URI symptoms, this involves an extensive number of treatment options, and is a complaint that carries with it a high risk of complications and morbidity.  The differential diagnosis includes bacterial sinusitis vs viral illness vs strep throat vs PNA vs labrynthitis vs hyperthyroidism vs CVST   Co morbidities that complicate the patient evaluation  None    Additional history obtained:  External records from outside source obtained and reviewed including normal thyroid studies on 09/13/2022   Lab Tests:  I Ordered, and personally interpreted labs.  The pertinent results include:  Leukocytosis of 10.8, nonspecific. Normal CMP, lipase, and UA. Negative pregnancy. Negative strep screen.   Cardiac Monitoring:  The patient was maintained on a cardiac monitor.  I personally viewed and interpreted the cardiac monitored which showed an underlying rhythm of: NSR   Medicines ordered and prescription drug  management:  I ordered medication including Toradol, Zofran, Tylenol, and IVF for supportive management  Reevaluation of the patient after these medicines showed that the patient improved I have reviewed the patients home medicines and have made adjustments as needed   Test Considered:  Respiratory viral panel   Problem List / ED Course:  Suspect viral URI given myalgias, sore throat. Afebrile in the ED. Tolerating secretions with normal phonation, no tripoding. No concern for PTA, RPA. Negative strep screen. Lightheadedness/dizziness felt secondary to poor PO intake, associated N/V. Improved with IVF. She did note some streaks of blood in emesis which is c/w mallory weiss tear. Doubt UGI bleed given lack of melena, hematochezia. She reports blood streaked emesis self-resolved. She has not tachycardia or hypotension to suggest acute/emergent blood loss. Abdomen soft, nontender; benign exam.  Cramping likely also related to underlying viral pathology. Tolerating PO fluids and feeling better on reassessment. Stable for d/c and PCP follow up.   Reevaluation:  After the interventions noted above, I reevaluated the patient and found that they have :improved   Social Determinants of Health:  Language barrier   Dispostion:  After consideration of the diagnostic results and the patients response to treatment, I feel that the patent would benefit from supportive care instructions, close PCP follow up. Return precautions discussed and provided. Patient discharged in stable condition with no unaddressed concerns.          Final Clinical Impression(s) / ED Diagnoses Final diagnoses:  Viral illness    Rx / DC Orders ED Discharge Orders          Ordered    ondansetron (ZOFRAN-ODT) 4 MG disintegrating tablet  Every 8 hours PRN        10/23/22 0127              Antony Madura, PA-C 10/31/22 1731    Rancour, Jeannett Senior, MD 10/31/22 [phone]

## 2022-10-23 ENCOUNTER — Telehealth: Payer: Self-pay | Admitting: Nurse Practitioner

## 2022-10-23 ENCOUNTER — Telehealth: Payer: Self-pay

## 2022-10-23 MED ORDER — ONDANSETRON 4 MG PO TBDP: 4.0000 mg | ORAL_TABLET | Freq: Three times a day (TID) | ORAL | 0 refills | Status: DC | PRN

## 2022-10-23 MED ORDER — ACETAMINOPHEN 325 MG PO TABS
650.0000 mg | ORAL_TABLET | Freq: Once | ORAL | Status: AC
  Administered 2022-10-23: 650 mg via ORAL
  Filled 2022-10-23: qty 2

## 2022-10-23 NOTE — Telephone Encounter (Signed)
Pt called you back and I advised you would call her tomorrow when you return.

## 2022-10-23 NOTE — Discharge Instructions (Signed)
Your symptoms are consistent with a viral illness. Take tylenol and/or ibuprofen for fever, headaches, body aches. Drink plenty of fluids to prevent dehydration. Should you continue to have nausea, use Zofran as prescribed.  You may continue to use other over-the-counter remedies for symptom control, if desired. Return for new or concerning symptoms such as worsening shortness of breath, coughing up blood, persistent vomiting, loss of consciousness.

## 2022-10-23 NOTE — Transitions of Care (Post Inpatient/ED Visit) (Signed)
   10/23/2022  Name: Alicia Bernard MRN: [id number] DOB: 2001-02-25  Today's TOC FU Call Status: Today's TOC FU Call Status:: Unsuccessul Call (1st Attempt) Unsuccessful Call (1st Attempt) Date: 10/23/22  Attempted to reach the patient regarding the most recent Inpatient/ED visit.  Follow Up Plan: Additional outreach attempts will be made to reach the patient to complete the Transitions of Care (Post Inpatient/ED visit) call.   Signature Keani Gotcher RMA

## 2022-10-25 NOTE — Transitions of Care (Post Inpatient/ED Visit) (Signed)
   10/25/2022  Name: Arin Clower MRN: [id number] DOB: 09/06/2000  Today's TOC FU Call Status: Today's TOC FU Call Status:: Successful TOC FU Call Competed TOC FU Call Complete Date: 10/25/22  Transition Care Management Follow-up Telephone Call Date of Discharge: 10/23/22 [facility]charge [facility]) Type of Discharge: Emergency Department Reason for ED Visit: Other: How have you been since you were released from the hospital?: Better Any questions or concerns?: No  Items Reviewed: Did you receive and understand the discharge instructions provided?: Yes Medications obtained,verified, and reconciled?: Yes (Medications Reviewed) Any new allergies since your discharge?: No Dietary orders reviewed?: NA Do you have support at home?: Yes People in Home: parent(s)  Medications Reviewed Today: Medications Reviewed Today     Reviewed by Tilford Pillar, CMA (Certified Medical Assistant) on 10/22/22 at 1800  Med List Status: <None>   Medication Order Taking? Sig Documenting Provider Last Dose Status Informant  doxycycline (VIBRA-TABS) 100 MG tablet [id number]  Take 1 tablet (100 mg total) by mouth 2 (two) times daily. Be sure to complete entire course of antibiotics.  Patient not taking: Reported on 09/13/2022   Tollie Eth, NP  Consider Medication Status and Discontinue (Patient Preference)   Vitamin D, Ergocalciferol, (DRISDOL) 1.25 MG (50000 UNIT) CAPS capsule [id number] Yes Take 1 capsule (50,000 Units total) by mouth every 7 (seven) days. Federico Flake, MD Past Week Active             Home Care and Equipment/Supplies: Were Home Health Services Ordered?: No Any new equipment or medical supplies ordered?: No  Functional Questionnaire: Do you need assistance with bathing/showering or dressing?: No Do you need assistance with meal preparation?: No Do you need assistance with eating?: No Do you have difficulty maintaining continence: No Do you need assistance with  getting out of bed/getting out of a chair/moving?: No Do you have difficulty managing or taking your medications?: No  Follow up appointments reviewed: PCP Follow-up appointment confirmed?: Yes Date of PCP follow-up appointment?: 10/29/22 Follow-up Provider: 10:45 on 10/29/22 with [place] Early NP [facility] Follow-up appointment confirmed?: NA Do you need transportation to your follow-up appointment?: No Do you understand care options if your condition(s) worsen?: Yes-patient verbalized understanding    SIGNATURE Clyda Hurdle RMA

## 2022-10-29 ENCOUNTER — Inpatient Hospital Stay: Payer: Self-pay | Admitting: Nurse Practitioner

## 2022-11-01 ENCOUNTER — Encounter: Payer: Self-pay | Admitting: Nurse Practitioner

## 2022-11-01 ENCOUNTER — Ambulatory Visit (INDEPENDENT_AMBULATORY_CARE_PROVIDER_SITE_OTHER): Payer: Medicaid Other | Admitting: Nurse Practitioner

## 2022-11-01 VITALS — BP 102/60 | HR 62 | Temp 98.1°F | Resp 14 | Wt 94.4 lb

## 2022-11-01 DIAGNOSIS — Z8619 Personal history of other infectious and parasitic diseases: Secondary | ICD-10-CM

## 2022-11-01 DIAGNOSIS — R8271 Bacteriuria: Secondary | ICD-10-CM | POA: Diagnosis not present

## 2022-11-01 DIAGNOSIS — K2100 Gastro-esophageal reflux disease with esophagitis, without bleeding: Secondary | ICD-10-CM | POA: Diagnosis not present

## 2022-11-01 MED ORDER — PANTOPRAZOLE SODIUM 40 MG PO TBEC
40.0000 mg | DELAYED_RELEASE_TABLET | Freq: Every day | ORAL | 11 refills | Status: DC
Start: 2022-11-01 — End: 2022-12-13

## 2022-11-01 MED ORDER — NITROFURANTOIN MONOHYD MACRO 100 MG PO CAPS
100.0000 mg | ORAL_CAPSULE | Freq: Two times a day (BID) | ORAL | 0 refills | Status: DC
Start: 2022-11-01 — End: 2023-10-31

## 2022-11-01 NOTE — Progress Notes (Signed)
Tollie Eth, DNP, AGNP-c [facility] [address] [place], [place] [address] [phone]  Subjective:   Alicia Bernard is a 22 y.o. female presents to day for evaluation of: Hospital F/U 10/22/2022 for Viral Illness  Alicia Bernard presents today for follow-up after a recent ER visit 10 days ago for a severe viral illness. She reports symptoms of dizziness, headaches, runny nose, sore throat, feeling chilly, and vomiting with a pink-brown color at that time. She notes the vomiting had turned bloody but has not recurred since the hospital visit. Her symptoms initially began with a red discharge and absence of her expected menstrual period, followed by the onset of the other symptoms. Tests done in the ER showed an elevated white blood cell count, negative pregnancy test, normal pancreas inflammation markers, and a slight bacterial presence in her urine which was not treated as a UTI at that time.  Alicia Bernard also reports experiencing symptoms consistent with acid reflux, which she describes as possibly related to GERD.   Alicia Bernard expresses confusion and concern about the initial spotting and unusual changes in her body, describing the experience as "weird" and noting her body is "atypical" or "unique." She remains vigilant about her health and has been instructed to report any recurrence of symptoms or new concerns.  Hospital Evaluation: "22 year old female presents to the emergency department for evaluation of URI symptoms. She began feeling ill 2 days ago, reporting sore throat, body aches, lightheadedness/dizziness. Her symptoms have persisted and now involve a global headache today as well as abdominal cramping, nausea. She has 3 episodes of emesis streaked with blood, "pink and brown color". Reports this spontaneously resolved as subsequent emesis has been nonbloody. No medications taken PTA for symptoms. Denies fevers, melena, hematochezia, urinary symptoms, CP, SOB. Denies sick contacts.  No prior abdominal surgeries. "  "Suspect viral URI given myalgias, sore throat. Afebrile in the ED. Tolerating secretions with normal phonation, no tripoding. No concern for PTA, RPA. Negative strep screen. Lightheadedness/dizziness felt secondary to poor PO intake, associated N/V. Improved with IVF. She did note some streaks of blood in emesis which is c/w mallory weiss tear. Doubt UGI bleed given lack of melena, hematochezia. She reports blood streaked emesis self-resolved. She has not tachycardia or hypotension to suggest acute/emergent blood loss. Abdomen soft, nontender; benign exam. Cramping likely also related to underlying viral pathology. Tolerating PO fluids and feeling better on reassessment. Stable for d/c and PCP follow up."   PMH, Medications, and Allergies reviewed and updated in chart as appropriate.   ROS negative except for what is listed in HPI. Objective:  BP 102/60   Pulse 62   Temp 98.1 F (36.7 C) (Oral)   Resp 14   Wt 94 lb 6.4 oz (42.8 kg)   LMP 10/25/2022   SpO2 99% Comment: room air  BMI 19.07 kg/m  Physical Exam Vitals and nursing note reviewed.  Constitutional:      Appearance: Normal appearance.  HENT:     Head: Normocephalic.  Eyes:     Pupils: Pupils are equal, round, and reactive to light.  Cardiovascular:     Rate and Rhythm: Normal rate and regular rhythm.     Pulses: Normal pulses.     Heart sounds: Normal heart sounds.  Pulmonary:     Effort: Pulmonary effort is normal.     Breath sounds: Normal breath sounds.  Abdominal:     General: Bowel sounds are normal. There is no distension.     Palpations: Abdomen is soft.  Tenderness: There is no abdominal tenderness. There is no right CVA tenderness, left CVA tenderness, guarding or rebound.  Musculoskeletal:        General: Normal range of motion.     Cervical back: Normal range of motion.  Skin:    General: Skin is warm.  Neurological:     General: No focal deficit present.      Mental Status: She is alert and oriented to person, place, and time.  Psychiatric:        Mood and Affect: Mood normal.           Assessment & Plan:   Problem List Items Addressed This Visit     H/O viral illness - Primary    Patient presented with a history of dizziness, headaches, runny nose, sore throat, and feeling chilly, followed by vomiting with pink-brown color. The patient's white blood cell count was elevated, indicating a possible viral illness or stomach bug. Plan: - No further intervention needed at this time, as you are recovering and symptoms have resolved. - Report any recurrence of symptoms.       Relevant Medications   pantoprazole (PROTONIX) 40 MG tablet   Gastroesophageal reflux disease with esophagitis without hemorrhage    You reported experiencing acid reflux, which could be due to GERD. This may have been caused by excess stomach acid or a weak flap on the top of the stomach, leading to an ulcer and bloody vomiting. Plan: - I have prescribed Pantoprazole to decrease stomach acid production and Carafate to coat the stomach and help heal any ulcerations. - Your prescription has been sent to [facility]. If the cost is more than $5, it will be transferred to the [facility] for a lower cost. - Review the provided information on reflux and food choices.       Relevant Medications   pantoprazole (PROTONIX) 40 MG tablet   Bacteria in urine    You had a small amount of bacteria in the urine, but were not treated for a urinary infection during the emergency room visit. Plan: - I have prescribed an appropriate antibiotic to clear the bacterial infection. - Complete the full course of antibiotics and report any worsening or persistent symptoms.      Relevant Medications   nitrofurantoin, macrocrystal-monohydrate, (MACROBID) 100 MG capsule      Tollie Eth, DNP, AGNP-c 12/02/2022  9:02 PM    History, Medications, Surgery, SDOH, and Family History  reviewed and updated as appropriate.

## 2022-11-01 NOTE — Patient Instructions (Addendum)
Gastroesophageal Reflux Disease, Adult Gastroesophageal reflux (GER) happens when acid from the stomach flows up into the tube that connects the mouth and the stomach (esophagus). Normally, food travels down the esophagus and stays in the stomach to be digested. However, when a person has GER, food and stomach acid sometimes move back up into the esophagus. If this becomes a more serious problem, the person may be diagnosed with a disease called gastroesophageal reflux disease (GERD). GERD occurs when the reflux: Happens often. Causes frequent or severe symptoms. Causes problems such as damage to the esophagus. When stomach acid comes in contact with the esophagus, the acid may cause inflammation in the esophagus. Over time, GERD may create small holes (ulcers) in the lining of the esophagus. What are the causes? This condition is caused by a problem with the muscle between the esophagus and the stomach (lower esophageal sphincter, or LES). Normally, the LES muscle closes after food passes through the esophagus to the stomach. When the LES is weakened or abnormal, it does not close properly, and that allows food and stomach acid to go back up into the esophagus. The LES can be weakened by certain dietary substances, medicines, and medical conditions, including: Tobacco use. Pregnancy. Having a hiatal hernia. Alcohol use. Certain foods and beverages, such as coffee, chocolate, onions, and peppermint. What increases the risk? You are more likely to develop this condition if you: Have an increased body weight. Have a connective tissue disorder. Take NSAIDs, such as ibuprofen. What are the signs or symptoms? Symptoms of this condition include: Heartburn. Difficult or painful swallowing and the feeling of having a lump in the throat. A bitter taste in the mouth. Bad breath and having a large amount of saliva. Having an upset or bloated stomach and belching. Chest pain. Different conditions can  cause chest pain. Make sure you see your health care provider if you experience chest pain. Shortness of breath or wheezing. Ongoing (chronic) cough or a nighttime cough. Wearing away of tooth enamel. Weight loss. How is this diagnosed? This condition may be diagnosed based on a medical history and a physical exam. To determine if you have mild or severe GERD, your health care provider may also monitor how you respond to treatment. You may also have tests, including: A test to examine your stomach and esophagus with a small camera (endoscopy). A test that measures the acidity level in your esophagus. A test that measures how much pressure is on your esophagus. A barium swallow or modified barium swallow test to show the shape, size, and functioning of your esophagus. How is this treated? Treatment for this condition may vary depending on how severe your symptoms are. Your health care provider may recommend: Changes to your diet. Medicine. Surgery. The goal of treatment is to help relieve your symptoms and to prevent complications. Follow these instructions at home: Eating and drinking  Follow a diet as recommended by your health care provider. This may involve avoiding foods and drinks such as: Coffee and tea, with or without caffeine. Drinks that contain alcohol. Energy drinks and sports drinks. Carbonated drinks or sodas. Chocolate and cocoa. Peppermint and mint flavorings. Garlic and onions. Horseradish. Spicy and acidic foods, including peppers, chili powder, curry powder, vinegar, hot sauces, and barbecue sauce. Citrus fruit juices and citrus fruits, such as oranges, lemons, and limes. Tomato-based foods, such as red sauce, chili, salsa, and pizza with red sauce. Fried and fatty foods, such as donuts, french fries, potato chips, and high-fat dressings.  High-fat meats, such as hot dogs and fatty cuts of red and white meats, such as rib eye steak, sausage, ham, and  bacon. High-fat dairy items, such as whole milk, butter, and cream cheese. Eat small, frequent meals instead of large meals. Avoid drinking large amounts of liquid with your meals. Avoid eating meals during the 2-3 hours before bedtime. Avoid lying down right after you eat. Do not exercise right after you eat. Lifestyle  Do not use any products that contain nicotine or tobacco. These products include cigarettes, chewing tobacco, and vaping devices, such as e-cigarettes. If you need help quitting, ask your health care provider. Try to reduce your stress by using methods such as yoga or meditation. If you need help reducing stress, ask your health care provider. If you are overweight, reduce your weight to an amount that is healthy for you. Ask your health care provider for guidance about a safe weight loss goal. General instructions Pay attention to any changes in your symptoms. Take over-the-counter and prescription medicines only as told by your health care provider. Do not take aspirin, ibuprofen, or other NSAIDs unless your health care provider told you to take these medicines. Wear loose-fitting clothing. Do not wear anything tight around your waist that causes pressure on your abdomen. Raise (elevate) the head of your bed about 6 inches (15 cm). You can use a wedge to do this. Avoid bending over if this makes your symptoms worse. Keep all follow-up visits. This is important. Contact a health care provider if: You have: New symptoms. Unexplained weight loss. Difficulty swallowing or it hurts to swallow. Wheezing or a persistent cough. A hoarse voice. Your symptoms do not improve with treatment. Get help right away if: You have sudden pain in your arms, neck, jaw, teeth, or back. You suddenly feel sweaty, dizzy, or light-headed. You have chest pain or shortness of breath. You vomit and the vomit is green, yellow, or black, or it looks like blood or coffee grounds. You faint. You  have stool that is red, bloody, or black. You cannot swallow, drink, or eat. These symptoms may represent a serious problem that is an emergency. Do not wait to see if the symptoms will go away. Get medical help right away. Call your local emergency services (911 in the [place] Do not drive yourself to the hospital. Summary Gastroesophageal reflux happens when acid from the stomach flows up into the esophagus. GERD is a disease in which the reflux happens often, causes frequent or severe symptoms, or causes problems such as damage to the esophagus. Treatment for this condition may vary depending on how severe your symptoms are. Your health care provider may recommend diet and lifestyle changes, medicine, or surgery. Contact a health care provider if you have new or worsening symptoms. Take over-the-counter and prescription medicines only as told by your health care provider. Do not take aspirin, ibuprofen, or other NSAIDs unless your health care provider told you to do so. Keep all follow-up visits as told by your health care provider. This is important. This information is not intended to replace advice given to you by your health care provider. Make sure you discuss any questions you have with your health care provider. Document Revised: 12/06/2019 Document Reviewed: 12/06/2019 Elsevier Patient Education  2024 [employer] Food Choices for Gastroesophageal Reflux Disease, Adult When you have gastroesophageal reflux disease (GERD), the foods you eat and your eating habits are very important. Choosing the right foods can help ease the discomfort of  GERD. Consider working with a dietitian to help you make healthy food choices. What are tips for following this plan? Reading food labels Look for foods that are low in saturated fat. Foods that have less than 5% of daily value (DV) of fat and 0 g of trans fats may help with your symptoms. Cooking Cook foods using methods other than frying. This may  include baking, steaming, grilling, or broiling. These are all methods that do not need a lot of fat for cooking. To add flavor, try to use herbs that are low in spice and acidity. Meal planning  Choose healthy foods that are low in fat, such as fruits, vegetables, whole grains, low-fat dairy products, lean meats, fish, and poultry. Eat frequent, small meals instead of three large meals each day. Eat your meals slowly, in a relaxed setting. Avoid bending over or lying down until 2-3 hours after eating. Limit high-fat foods such as fatty meats or fried foods. Limit your intake of fatty foods, such as oils, butter, and shortening. Avoid the following as told by your health care provider: Foods that cause symptoms. These may be different for different people. Keep a food diary to keep track of foods that cause symptoms. Alcohol. Drinking large amounts of liquid with meals. Eating meals during the 2-3 hours before bed. Lifestyle Maintain a healthy weight. Ask your health care provider what weight is healthy for you. If you need to lose weight, work with your health care provider to do so safely. Exercise for at least 30 minutes on 5 or more days each week, or as told by your health care provider. Avoid wearing clothes that fit tightly around your waist and chest. Do not use any products that contain nicotine or tobacco. These products include cigarettes, chewing tobacco, and vaping devices, such as e-cigarettes. If you need help quitting, ask your health care provider. Sleep with the head of your bed raised. Use a wedge under the mattress or blocks under the bed frame to raise the head of the bed. Chew sugar-free gum after mealtimes. What foods should I eat?  Eat a healthy, well-balanced diet of fruits, vegetables, whole grains, low-fat dairy products, lean meats, fish, and poultry. Each person is different. Foods that may trigger symptoms in one person may not trigger any symptoms in another  person. Work with your health care provider to identify foods that are safe for you. The items listed above may not be a complete list of recommended foods and beverages. Contact a dietitian for more information. What foods should I avoid? Limiting some of these foods may help manage the symptoms of GERD. Everyone is different. Consult a dietitian or your health care provider to help you identify the exact foods to avoid, if any. Fruits Any fruits prepared with added fat. Any fruits that cause symptoms. For some people this may include citrus fruits, such as oranges, grapefruit, pineapple, and lemons. Vegetables Deep-fried vegetables. [place] fries. Any vegetables prepared with added fat. Any vegetables that cause symptoms. For some people, this may include tomatoes and tomato products, chili peppers, onions and garlic, and horseradish. Grains Pastries or quick breads with added fat. Meats and other proteins High-fat meats, such as fatty beef or pork, hot dogs, ribs, ham, sausage, salami, and bacon. Fried meat or protein, including fried fish and fried chicken. Nuts and nut butters, in large amounts. Dairy Whole milk and chocolate milk. Sour cream. Cream. Ice cream. Cream cheese. Milkshakes. Fats and oils Butter. Margarine. Shortening. Ghee. Beverages  Coffee and tea, with or without caffeine. Carbonated beverages. Sodas. Energy drinks. Fruit juice made with acidic fruits, such as orange or grapefruit. Tomato juice. Alcoholic drinks. Sweets and desserts Chocolate and cocoa. Donuts. Seasonings and condiments Pepper. Peppermint and spearmint. Added salt. Any condiments, herbs, or seasonings that cause symptoms. For some people, this may include curry, hot sauce, or vinegar-based salad dressings. The items listed above may not be a complete list of foods and beverages to avoid. Contact a dietitian for more information. Questions to ask your health care provider Diet and lifestyle changes are  usually the first steps that are taken to manage symptoms of GERD. If diet and lifestyle changes do not improve your symptoms, talk with your health care provider about taking medicines. Where to find more information [employer] G[employer]intestinal [employer]: aboutgerd.org Summary When you have gastroesophageal reflux disease (GERD), food and lifestyle choices may be very helpful in easing the discomfort of GERD. Eat frequent, small meals instead of three large meals each day. Eat your meals slowly, in a relaxed setting. Avoid bending over or lying down until 2-3 hours after eating. Limit high-fat foods such as fatty meats or fried foods. This information is not intended to replace advice given to you by your health care provider. Make sure you discuss any questions you have with your health care provider. Document Revised: 12/06/2019 Document Reviewed: 12/06/2019 Elsevier Patient Education  2024 [employer].

## 2022-12-02 DIAGNOSIS — K2100 Gastro-esophageal reflux disease with esophagitis, without bleeding: Secondary | ICD-10-CM | POA: Insufficient documentation

## 2022-12-02 DIAGNOSIS — R8271 Bacteriuria: Secondary | ICD-10-CM | POA: Insufficient documentation

## 2022-12-02 NOTE — Assessment & Plan Note (Signed)
You had a small amount of bacteria in the urine, but were not treated for a urinary infection during the emergency room visit. Plan: - I have prescribed an appropriate antibiotic to clear the bacterial infection. - Complete the full course of antibiotics and report any worsening or persistent symptoms.

## 2022-12-02 NOTE — Assessment & Plan Note (Signed)
You reported experiencing acid reflux, which could be due to GERD. This may have been caused by excess stomach acid or a weak flap on the top of the stomach, leading to an ulcer and bloody vomiting. Plan: - I have prescribed Pantoprazole to decrease stomach acid production and Carafate to coat the stomach and help heal any ulcerations. - Your prescription has been sent to [facility]. If the cost is more than $5, it will be transferred to the [facility] for a lower cost. - Review the provided information on reflux and food choices.

## 2022-12-02 NOTE — Assessment & Plan Note (Signed)
Patient presented with a history of dizziness, headaches, runny nose, sore throat, and feeling chilly, followed by vomiting with pink-brown color. The patient's white blood cell count was elevated, indicating a possible viral illness or stomach bug. Plan: - No further intervention needed at this time, as you are recovering and symptoms have resolved. - Report any recurrence of symptoms.

## 2022-12-13 ENCOUNTER — Emergency Department (HOSPITAL_COMMUNITY): Payer: Medicaid Other

## 2022-12-13 ENCOUNTER — Encounter (HOSPITAL_COMMUNITY): Payer: Self-pay

## 2022-12-13 ENCOUNTER — Emergency Department (HOSPITAL_COMMUNITY)
Admission: EM | Admit: 2022-12-13 | Discharge: 2022-12-13 | Disposition: A | Discharge status: Home / Self Care | Payer: Medicaid Other | Attending: Emergency Medicine | Admitting: Emergency Medicine

## 2022-12-13 ENCOUNTER — Other Ambulatory Visit: Payer: Self-pay

## 2022-12-13 DIAGNOSIS — Z8619 Personal history of other infectious and parasitic diseases: Secondary | ICD-10-CM

## 2022-12-13 DIAGNOSIS — R1012 Left upper quadrant pain: Secondary | ICD-10-CM

## 2022-12-13 DIAGNOSIS — K2100 Gastro-esophageal reflux disease with esophagitis, without bleeding: Secondary | ICD-10-CM

## 2022-12-13 DIAGNOSIS — R109 Unspecified abdominal pain: Secondary | ICD-10-CM | POA: Diagnosis present

## 2022-12-13 LAB — URINALYSIS, ROUTINE W REFLEX MICROSCOPIC
Bilirubin Urine: NEGATIVE
Glucose, UA: NEGATIVE mg/dL
Hgb urine dipstick: NEGATIVE
Ketones, ur: NEGATIVE mg/dL
Nitrite: NEGATIVE
Protein, ur: NEGATIVE mg/dL
Specific Gravity, Urine: 1.023 (ref 1.005–1.030)
pH: 5 (ref 5.0–8.0)

## 2022-12-13 LAB — BASIC METABOLIC PANEL
Anion gap: 10 (ref 5–15)
BUN: 13 mg/dL (ref 6–20)
CO2: 23 mmol/L (ref 22–32)
Calcium: 9.3 mg/dL (ref 8.9–10.3)
Chloride: 101 mmol/L (ref 98–111)
Creatinine, Ser: 0.86 mg/dL (ref 0.44–1.00)
GFR, Estimated: >60 mL/min (ref >60)
Glucose, Bld: 90 mg/dL (ref 70–99)
Potassium: 3.2 mmol/L — ABNORMAL LOW (ref 3.5–5.1)
Sodium: 134 mmol/L — ABNORMAL LOW (ref 135–145)

## 2022-12-13 LAB — HCG, SERUM, QUALITATIVE: Preg, Serum: NEGATIVE

## 2022-12-13 LAB — CBC
HCT: 38.8 % (ref 36.0–46.0)
Hemoglobin: 12.5 g/dL (ref 12.0–15.0)
MCH: 25.7 pg — ABNORMAL LOW (ref 26.0–34.0)
MCHC: 32.2 g/dL (ref 30.0–36.0)
MCV: 79.8 fL — ABNORMAL LOW (ref 80.0–100.0)
Platelets: 387 10*3/uL (ref 150–400)
RBC: 4.86 MIL/uL (ref 3.87–5.11)
RDW: 12.9 % (ref 11.5–15.5)
WBC: 8.1 10*3/uL (ref 4.0–10.5)
nRBC: 0 % (ref 0.0–0.2)

## 2022-12-13 MED ORDER — PANTOPRAZOLE SODIUM 40 MG PO TBEC
40.0000 mg | DELAYED_RELEASE_TABLET | Freq: Every day | ORAL | 3 refills | Status: DC
Start: 2022-12-13 — End: 2023-10-31

## 2022-12-13 NOTE — ED Triage Notes (Signed)
Pt arrived POV. C/O L side pain that is sharp when moving. Pain onset 2 days ago.

## 2022-12-13 NOTE — Discharge Instructions (Signed)
Avoid ibuprofen, advil and aleve.  No acidic or spicy foods.  Do not eat 2 hours before going to bed.  Start back on the medication but if the medication is not working then follow up with NP Early.

## 2022-12-13 NOTE — ED Provider Notes (Signed)
[place] EMERGENCY DEPARTMENT AT [facility] Provider Note   CSN: [id number] Arrival date & time: 12/13/22  1812     History  Chief Complaint  Patient presents with   Flank Pain    Alicia Bernard is a 22 y.o. female.  Patient is a 22 year old female with a history of GERD but otherwise healthy who is presenting today with complaints of left-sided abdominal pain.  Patient states that the pain started 2 days ago.  Initially it was just a little bit on her left side but now it has been more severe today.  It is a sharp uncomfortable sensation.  It seems to be worse with certain movements.  However she does feel like she is still eating and is not feel that eating makes it worse.  She denies any nausea or vomiting.  No urinary or bowel symptoms.  She did not suffer with constipation and denies any trauma or heavy lifting or anything that would have pulled a muscle.  She has been urinating normally and last menses was in June.  She does report a history of having pain in the past like this and recently saw her PCP in May and at that time was given Protonix which she reports she took for 30 days and her symptoms went away but she did not have any further refills and she has been off of the medication for several weeks and now the pain feels similar.  The history is provided by the patient and medical records.  Flank Pain       Home Medications Prior to Admission medications   Medication Sig Start Date End Date Taking? Authorizing Provider  nitrofurantoin, macrocrystal-monohydrate, (MACROBID) 100 MG capsule Take 1 capsule (100 mg total) by mouth 2 (two) times daily. 11/01/22   Tollie Eth, NP  ondansetron (ZOFRAN-ODT) 4 MG disintegrating tablet Take 1 tablet (4 mg total) by mouth every 8 (eight) hours as needed for nausea or vomiting. 10/23/22   Antony Madura, PA-C  pantoprazole (PROTONIX) 40 MG tablet Take 1 tablet (40 mg total) by mouth daily. 12/13/22   Gwyneth Sprout, MD  Vitamin  D, Ergocalciferol, (DRISDOL) 1.25 MG (50000 UNIT) CAPS capsule Take 1 capsule (50,000 Units total) by mouth every 7 (seven) days. 09/23/22   Federico Flake, MD      Allergies    Patient has no known allergies.    Review of Systems   Review of Systems  Genitourinary:  Positive for flank pain.    Physical Exam Updated Vital Signs BP 112/78 (BP Location: Right Arm)   Pulse 63   Temp 98.4 F (36.9 C) (Oral)   Resp 20   Ht 4\' 11"  (1.499 m)   Wt 44.5 kg   SpO2 100%   BMI 19.79 kg/m  Physical Exam Vitals and nursing note reviewed.  Constitutional:      General: She is not in acute distress.    Appearance: She is well-developed.  HENT:     Head: Normocephalic and atraumatic.  Eyes:     Pupils: Pupils are equal, round, and reactive to light.  Cardiovascular:     Rate and Rhythm: Normal rate and regular rhythm.     Heart sounds: Normal heart sounds. No murmur heard.    No friction rub.  Pulmonary:     Effort: Pulmonary effort is normal.     Breath sounds: Normal breath sounds. No wheezing or rales.  Abdominal:     General: Bowel sounds are normal. There  is no distension.     Palpations: Abdomen is soft.     Tenderness: There is no abdominal tenderness. There is left CVA tenderness. There is no guarding or rebound.    Musculoskeletal:        General: No tenderness. Normal range of motion.     Comments: No edema  Skin:    General: Skin is warm and dry.     Findings: No rash.  Neurological:     Mental Status: She is alert and oriented to person, place, and time. Mental status is at baseline.     Cranial Nerves: No cranial nerve deficit.  Psychiatric:        Mood and Affect: Mood normal.        Behavior: Behavior normal.     ED Results / Procedures / Treatments   Labs (all labs ordered are listed, but only abnormal results are displayed) Labs Reviewed  URINALYSIS, ROUTINE W REFLEX MICROSCOPIC - Abnormal; Notable for the following components:      Result Value    APPearance HAZY (*)    Leukocytes,Ua SMALL (*)    Bacteria, UA RARE (*)    All other components within normal limits  BASIC METABOLIC PANEL - Abnormal; Notable for the following components:   Sodium 134 (*)    Potassium 3.2 (*)    All other components within normal limits  CBC - Abnormal; Notable for the following components:   MCV 79.8 (*)    MCH 25.7 (*)    All other components within normal limits  HCG, SERUM, QUALITATIVE    EKG None  Radiology US RENAL  Result Date: 12/13/2022 CLINICAL DATA:  Acute left flank pain. EXAM: RENAL / URINARY TRACT ULTRASOUND COMPLETE COMPARISON:  11/15/2021. FINDINGS: Right Kidney: Renal measurements: 10.1 x 3.6 x 4.6 cm = volume: 87.4 mL. Echogenicity within normal limits. There is mild dilatation of the right renal pelvis. No renal calculus is seen. Left Kidney: Renal measurements: 10.0 x 4.9 x 3.9 cm = volume: 98.5 mL. Echogenicity within normal limits. No mass or hydronephrosis visualized. Bladder: Appears normal for degree of bladder distention. Bilateral ureteral jets are noted. Other: None. IMPRESSION: 1. Mild dilatation of the right renal pelvis, possible extrarenal pelvis versus mild hydronephrosis. 2. No hydronephrosis on the left. 3. Normal bladder. Electronically Signed   By: Thornell Sartorius M.D.   On: 12/13/2022 21:31    Procedures Procedures    Medications Ordered in ED Medications - No data to display  ED Course/ Medical Decision Making/ A&P                             Medical Decision Making Amount and/or Complexity of Data Reviewed External Data Reviewed: notes.    Details: pcp Labs: ordered. Decision-making details documented in ED Course. Radiology: ordered and independent interpretation performed. Decision-making details documented in ED Course.  Risk Prescription drug management.   Pt  presenting today with a complaint that caries a high risk for morbidity and mortality.  Here today with side/abdominal pain.  Patient's  symptoms are nonspecific but she does have some flank tenderness on exam as well as some left upper quadrant tenderness.  She has no other associated symptoms including no urinary or bowel changes.  She denies any vaginal issues and menses have been normal.  Patient's vital signs are normal and she is well-appearing.  Patient has been seen for this in the past and had a CT  scan 1 year ago which was negative.  She has followed up with her PCP in May and at that time was started on Protonix which she reports did help with her pain which she feels is similar today as then.  She does not use alcohol or take excessive NSAIDs.  She has not spent significant time outside of the country and denies bad food exposure.  Low suspicion at this time for torsion, ectopic pregnancy, TOA, appendicitis or diverticulitis.  No findings to suggest perforation.  She is not having symptoms suggestive of pancreatitis.  I independently interpreted patient's labs, hCG is negative,, CBC and BMP are within normal limits, UA without evidence of blood or infection at this time but sample is contaminated.  Given some flank pain we will do an ultrasound just to ensure no evidence of hydronephrosis but suspect this is most likely gastritis given her prior history.  She had a CT scan done 1 year ago 1 to avoid radiation which was discussed with the patient given her age and already having a CAT scan last year.  She was comfortable with this plan.  Patient does not want any pain medication at this time.  9:50 PM I have independently visualized and interpreted pt's images today.  Ultrasound today without significant findings of hydronephrosis on the left.  Radiology reports mild dilatation of the right renal pelvis possibly extrarenal pelvis versus mild hydronephrosis but the left side is normal and the bladder is normal.  Patient has no pain on the right and do not feel that that is causing any pain.  At this time feel that patient is stable for  discharge home to follow-up with her PCP and may need to see GI in the future given the ongoing pain.  Will start her back on Protonix at this time until she follows up with her doctor.          Final Clinical Impression(s) / ED Diagnoses Final diagnoses:  Left upper quadrant abdominal pain    Rx / DC Orders ED Discharge Orders          Ordered    pantoprazole (PROTONIX) 40 MG tablet  Daily        12/13/22 2148              Gwyneth Sprout, MD 12/13/22 2151

## 2023-05-06 ENCOUNTER — Encounter: Payer: Self-pay | Admitting: Obstetrics & Gynecology

## 2023-05-06 ENCOUNTER — Ambulatory Visit (INDEPENDENT_AMBULATORY_CARE_PROVIDER_SITE_OTHER): Payer: Medicaid Other | Admitting: Obstetrics & Gynecology

## 2023-05-06 VITALS — BP 105/68 | HR 84 | Wt 97.8 lb

## 2023-05-06 DIAGNOSIS — N926 Irregular menstruation, unspecified: Secondary | ICD-10-CM

## 2023-05-06 NOTE — Progress Notes (Signed)
   GYNECOLOGY OFFICE VISIT NOTE  History:   Alicia Bernard is a 22 y.o. G0P0000 here today for evaluation of irregular periods. Has a history of irregular periods for years, does miss periods occasionally.  She is concerned she did not have a period in October. She is sexually active, did not take a pregnancy test.  Also had some abnormal discharge for one week, not currently. Also had mild cramping then, not currently/ She denies any abnormal vaginal discharge, bleeding, pelvic pain, urinary symptoms or other concerns.    Past Medical History:  Diagnosis Date   Headache(784.0)     Past Surgical History:  Procedure Laterality Date   ELBOW ARTHROSCOPY     age 71 left elbow surgery from a break    The following portions of the patient's history were reviewed and updated as appropriate: allergies, current medications, past family history, past medical history, past social history, past surgical history and problem list.    Review of Systems:  Pertinent items noted in HPI and remainder of comprehensive ROS otherwise negative.  Physical Exam:  BP 105/68   Pulse 84   Wt 97 lb 12.8 oz (44.4 kg)   LMP 04/06/2023 (Approximate)   BMI 19.75 kg/m  CONSTITUTIONAL: Well-developed, well-nourished female in no acute distress.  HEENT:  Normocephalic, atraumatic. External right and left ear normal. No scleral icterus.  NECK: Normal range of motion, supple, no masses noted on observation SKIN: No rash noted. Not diaphoretic. No erythema. No pallor. MUSCULOSKELETAL: Normal range of motion. No edema noted. NEUROLOGIC: Alert and oriented to person, place, and time. Normal muscle tone coordination. No cranial nerve deficit noted. PSYCHIATRIC: Normal mood and affect. Normal behavior. Normal judgment and thought content. CARDIOVASCULAR: Normal heart rate noted RESPIRATORY: Effort and breath sounds normal, no problems with respiration noted ABDOMEN: No masses noted. No other overt distention noted.    PELVIC: Deferred    Assessment and Plan:     1. Irregular periods/menstrual cycles Patient has abnormal uterine bleeding.  Will order abnormal uterine bleeding evaluation labs.  Will contact patient with these results and plans for further evaluation/management. - Hemoglobin A1c - Beta hCG quant (ref lab) - Testosterone,Free and Total - Prolactin - TSH Rfx on Abnormal to Free T4 - Follicle stimulating hormone  Routine preventative health maintenance measures emphasized. Please refer to After Visit Summary for other counseling recommendations.   Return for any gynecologic concerns or after 10/02/2023 for annual exam and pap.    I spent 25 minutes dedicated to the care of this patient including pre-visit review of records, face to face time with the patient discussing her conditions and treatments and post visit orders.    Jaynie Collins, MD, FACOG Obstetrician & Gynecologist, [facility] for [employer], [facility]

## 2023-05-06 NOTE — Patient Instructions (Signed)
You

## 2023-05-08 LAB — BETA HCG QUANT (REF LAB): hCG Quant: <1 m[IU]/mL

## 2023-05-08 LAB — TSH RFX ON ABNORMAL TO FREE T4: TSH: 0.481 u[IU]/mL (ref 0.450–4.500)

## 2023-05-08 LAB — HEMOGLOBIN A1C
Est. average glucose Bld gHb Est-mCnc: 100 mg/dL
Hgb A1c MFr Bld: 5.1 % (ref 4.8–5.6)

## 2023-05-08 LAB — PROLACTIN: Prolactin: 8.5 ng/mL (ref 4.8–33.4)

## 2023-05-08 LAB — TESTOSTERONE,FREE AND TOTAL
Testosterone, Free: 1.8 pg/mL (ref 0.0–4.2)
Testosterone: 31 ng/dL (ref 13–71)

## 2023-05-08 LAB — FOLLICLE STIMULATING HORMONE: FSH: 6.2 m[IU]/mL

## 2023-05-13 ENCOUNTER — Encounter: Payer: Self-pay | Admitting: Nurse Practitioner

## 2023-07-15 ENCOUNTER — Telehealth: Payer: Self-pay | Admitting: Pediatrics

## 2023-07-15 NOTE — Telephone Encounter (Signed)
 Opened in error

## 2023-07-29 ENCOUNTER — Ambulatory Visit (INDEPENDENT_AMBULATORY_CARE_PROVIDER_SITE_OTHER): Payer: Self-pay

## 2023-07-29 ENCOUNTER — Other Ambulatory Visit (HOSPITAL_COMMUNITY)
Admission: RE | Admit: 2023-07-29 | Discharge: 2023-07-29 | Disposition: A | Discharge status: Home / Self Care | Payer: Self-pay | Source: Ambulatory Visit | Attending: Obstetrics and Gynecology | Admitting: Obstetrics and Gynecology

## 2023-07-29 DIAGNOSIS — Z113 Encounter for screening for infections with a predominantly sexual mode of transmission: Secondary | ICD-10-CM

## 2023-07-29 DIAGNOSIS — N898 Other specified noninflammatory disorders of vagina: Secondary | ICD-10-CM | POA: Insufficient documentation

## 2023-07-29 NOTE — Progress Notes (Signed)
 SUBJECTIVE:  23 y.o. female who desires a STI screen. Denies abnormal vaginal discharge, bleeding or significant pelvic pain. No UTI symptoms. Denies history of known exposure to STD. Pt requesting STI screen   No LMP recorded. (Menstrual status: Irregular Periods).  OBJECTIVE:  She appears well. Vaginal discharge x 4 days   ASSESSMENT:  STI Screen   PLAN:  Pt offered STI blood screening-not indicated GC, chlamydia, and trichomonas probe sent to lab.  Treatment: To be determined once lab results are received.  Pt follow up as needed.

## 2023-07-30 ENCOUNTER — Encounter: Payer: Self-pay | Admitting: Obstetrics and Gynecology

## 2023-07-30 LAB — CERVICOVAGINAL ANCILLARY ONLY
Bacterial Vaginitis (gardnerella): POSITIVE — AB
Candida Glabrata: NEGATIVE
Candida Vaginitis: POSITIVE — AB
Chlamydia: NEGATIVE
Comment: NEGATIVE
Comment: NEGATIVE
Comment: NEGATIVE
Comment: NEGATIVE
Comment: NEGATIVE
Comment: NORMAL
Neisseria Gonorrhea: NEGATIVE
Trichomonas: NEGATIVE

## 2023-07-30 MED ORDER — FLUCONAZOLE 150 MG PO TABS: 150.0000 mg | ORAL_TABLET | Freq: Once | ORAL | 0 refills | Status: DC

## 2023-07-30 MED ORDER — METRONIDAZOLE 500 MG PO TABS: 500.0000 mg | ORAL_TABLET | Freq: Two times a day (BID) | ORAL | 0 refills | Status: DC

## 2023-07-30 NOTE — Addendum Note (Signed)
 Addended by: Sheffield Bing on: 07/30/2023 08:44 AM   Modules accepted: Orders

## 2023-08-04 MED ORDER — FLUCONAZOLE 150 MG PO TABS: 150.0000 mg | ORAL_TABLET | Freq: Once | ORAL | 3 refills | Status: AC

## 2023-08-04 NOTE — Telephone Encounter (Signed)
 Will route to [facility].

## 2023-10-31 ENCOUNTER — Ambulatory Visit (HOSPITAL_COMMUNITY)
Admission: EM | Admit: 2023-10-31 | Discharge: 2023-10-31 | Discharge status: Against Medical Advice | Payer: Self-pay | Attending: Emergency Medicine | Admitting: Emergency Medicine

## 2023-10-31 ENCOUNTER — Encounter (HOSPITAL_COMMUNITY): Payer: Self-pay | Admitting: *Deleted

## 2023-10-31 ENCOUNTER — Telehealth (HOSPITAL_COMMUNITY): Payer: Self-pay

## 2023-10-31 DIAGNOSIS — N898 Other specified noninflammatory disorders of vagina: Secondary | ICD-10-CM

## 2023-10-31 DIAGNOSIS — Z3201 Encounter for pregnancy test, result positive: Secondary | ICD-10-CM

## 2023-10-31 LAB — POCT URINE PREGNANCY: Preg Test, Ur: POSITIVE — AB

## 2023-10-31 NOTE — ED Notes (Signed)
 Patient LWBS after triage. Patient was not discharged by a provider. Patient was not seen.

## 2023-10-31 NOTE — Discharge Instructions (Signed)
 Your results will come back over the next few days and someone will call if results are positive and require treatment.  Return here as needed.

## 2023-10-31 NOTE — ED Provider Notes (Signed)
 Patient left prior to being seen by this provider.  Urine pregnancy resulted as positive.  Asked clinical staff to inform patient of this result, and recommend the patient return for concerns about vaginal irritation and discharge.   Levora Reas A, NP 10/31/23 1353

## 2023-10-31 NOTE — Telephone Encounter (Signed)
  Component Ref Range & Units (hover) 13:16 (10/31/23)    Preg Test, Ur Positive Abnormal        Patient had to leave before being seen at today's appointment however urine pregnancy test was resulted. Per provider Patient informed of positive result. Patient informed that she may return as a walk in or make an appointment with us  online.   Patient informed and verbalized understanding.

## 2023-10-31 NOTE — ED Triage Notes (Signed)
 C/O vaginal irritation and vaginal discharge onset approx 2 wks ago. Denies any abd pain or fevers.

## 2023-10-31 NOTE — ED Notes (Signed)
 Patient states that she needed to leave in order to pick someone up. Patient informed that for further testing and treatment she would need to be seen by a provider. Patient verbalized understanding.   Patient LWBS after triage. Provider informed and states she would like the patient called and informed of positive pregnancy test.

## 2023-11-01 ENCOUNTER — Telehealth (HOSPITAL_COMMUNITY): Payer: Self-pay | Admitting: Emergency Medicine

## 2023-11-01 NOTE — Telephone Encounter (Signed)
 This RN returned patient's call about wanting "options and next steps" after finding out about her positive pregnancy test yesterday. This RN advised patient to schedule an appointment with OB-GYN for further care treatments. Pt asked "well what other options do I have if I don't want to do that?"  This RN [facility] her. Pt verbalized understanding.

## 2023-11-08 ENCOUNTER — Encounter: Payer: Self-pay | Admitting: Nurse Practitioner

## 2023-12-10 ENCOUNTER — Telehealth: Payer: Self-pay

## 2023-12-17 ENCOUNTER — Encounter: Payer: Self-pay | Admitting: Obstetrics and Gynecology

## 2024-03-05 ENCOUNTER — Ambulatory Visit (HOSPITAL_COMMUNITY)
Admission: EM | Admit: 2024-03-05 | Discharge: 2024-03-05 | Disposition: A | Discharge status: Home / Self Care | Payer: Self-pay | Attending: Family Medicine | Admitting: Family Medicine

## 2024-03-05 DIAGNOSIS — N3000 Acute cystitis without hematuria: Secondary | ICD-10-CM | POA: Insufficient documentation

## 2024-03-05 DIAGNOSIS — N898 Other specified noninflammatory disorders of vagina: Secondary | ICD-10-CM | POA: Insufficient documentation

## 2024-03-05 DIAGNOSIS — Z3202 Encounter for pregnancy test, result negative: Secondary | ICD-10-CM

## 2024-03-05 LAB — POCT URINALYSIS DIP (MANUAL ENTRY)
Bilirubin, UA: NEGATIVE
Blood, UA: NEGATIVE
Glucose, UA: NEGATIVE mg/dL
Ketones, POC UA: NEGATIVE mg/dL
Nitrite, UA: NEGATIVE
Protein Ur, POC: NEGATIVE mg/dL
Spec Grav, UA: >=1.03 — AB (ref 1.010–1.025)
Urobilinogen, UA: 0.2 U/dL
pH, UA: 5.5 (ref 5.0–8.0)

## 2024-03-05 LAB — POCT URINE PREGNANCY: Preg Test, Ur: NEGATIVE

## 2024-03-05 LAB — HIV ANTIBODY (ROUTINE TESTING W REFLEX): HIV Screen 4th Generation wRfx: NONREACTIVE

## 2024-03-05 MED ORDER — NITROFURANTOIN MONOHYD MACRO 100 MG PO CAPS: 100.0000 mg | ORAL_CAPSULE | Freq: Two times a day (BID) | ORAL | 0 refills | Status: AC

## 2024-03-05 NOTE — ED Triage Notes (Signed)
 Patient presents with vaginal itching and discharge x 1 week. Patient would like STD-testing.

## 2024-03-05 NOTE — Discharge Instructions (Addendum)
 The clinic will contact you with results of the vaginal swab/STD testing as well as urine culture done today if positive.  Start Macrobid  twice daily for 7 days.  Lots of rest and fluids.  Please follow-up with your PCP or gynecologist if your symptoms do not improve.  Please go to the ER for any worsening symptoms.  I hope you feel better soon!

## 2024-03-05 NOTE — ED Provider Notes (Signed)
 [facility]    CSN: [id number] Arrival date & time: 03/05/24  1201      History   Chief Complaint Chief Complaint  Patient presents with   Vaginal Discharge    HPI Alicia Bernard is a 23 y.o. female presents for vaginal discharge.  Patient reports 1 week of a nonpruritic nonmalodorous vaginal discharge.  States there is so much she feels like she peed herself.  Denies any dysuria, fevers, nausea/vomiting, flank pain.  No STD exposure but would like screening.  Has a history of yeast infection but states this is not consistent with that.  No history of BV.  No OTC medications have been new since onset.  No other concerns at this time   Vaginal Discharge   Past Medical History:  Diagnosis Date   Headache(784.0)     Patient Active Problem List   Diagnosis Date Noted   Gastroesophageal reflux disease with esophagitis without hemorrhage 12/02/2022   Bacteria in urine 12/02/2022   H/O viral illness 11/01/2022   Right sided abdominal pain 03/06/2022   Renal cyst, right 03/06/2022   Vitamin D  deficiency 03/06/2022   Chronic daily headache 08/13/2013   Tension headache 08/13/2013   Medication overuse headache 08/13/2013    Past Surgical History:  Procedure Laterality Date   ELBOW ARTHROSCOPY     age 25 left elbow surgery from a break    OB History     Gravida  0   Para  0   Term  0   Preterm  0   AB  0   Living  0      SAB  0   IAB  0   Ectopic  0   Multiple  0   Live Births  0            Home Medications    Prior to Admission medications   Medication Sig Start Date End Date Taking? Authorizing Provider  nitrofurantoin , macrocrystal-monohydrate, (MACROBID ) 100 MG capsule Take 1 capsule (100 mg total) by mouth 2 (two) times daily for 7 days. 03/05/24 03/12/24 Yes Loreda Myla SAUNDERS, NP    Family History Family History  Problem Relation Age of Onset   Lupus Mother    Seizures Mother    Stroke Mother    Hypertension Father    ADD / ADHD  Brother    Lung cancer Paternal Grandmother    Irritable bowel syndrome Neg Hx    Food intolerance Neg Hx    GI problems Neg Hx     Social History Social History   Tobacco Use   Smoking status: Never   Smokeless tobacco: Never  Vaping Use   Vaping status: Never Used  Substance Use Topics   Alcohol use: Never   Drug use: Never     Allergies   Patient has no known allergies.   Review of Systems Review of Systems  Genitourinary:  Positive for vaginal discharge.     Physical Exam Triage Vital Signs ED Triage Vitals [03/05/24 1234]  Encounter Vitals Group     BP 106/74     Girls Systolic BP Percentile      Girls Diastolic BP Percentile      Boys Systolic BP Percentile      Boys Diastolic BP Percentile      Pulse Rate (!) 106     Resp 18     Temp 98.4 F (36.9 C)     Temp Source Oral     SpO2 98 %  Weight      Height      Head Circumference      Peak Flow      Pain Score      Pain Loc      Pain Education      Exclude from Growth Chart    No data found.  Updated Vital Signs BP 106/74 (BP Location: Left Arm)   Pulse (!) 106   Temp 98.4 F (36.9 C) (Oral)   Resp 18   LMP 02/16/2024 (Approximate)   SpO2 98%   Visual Acuity Right Eye Distance:   Left Eye Distance:   Bilateral Distance:    Right Eye Near:   Left Eye Near:    Bilateral Near:     Physical Exam Vitals and nursing note reviewed.  Constitutional:      Appearance: Normal appearance.  HENT:     Head: Normocephalic and atraumatic.  Eyes:     Pupils: Pupils are equal, round, and reactive to light.  Cardiovascular:     Rate and Rhythm: Normal rate.  Pulmonary:     Effort: Pulmonary effort is normal.  Skin:    General: Skin is warm and dry.  Neurological:     General: No focal deficit present.     Mental Status: She is alert and oriented to person, place, and time.  Psychiatric:        Mood and Affect: Mood normal.        Behavior: Behavior normal.      UC Treatments /  Results  Labs (all labs ordered are listed, but only abnormal results are displayed) Labs Reviewed  POCT URINALYSIS DIP (MANUAL ENTRY) - Abnormal; Notable for the following components:      Result Value   Clarity, UA turbid (*)    Spec Grav, UA >=1.030 (*)    Leukocytes, UA Small (1+) (*)    All other components within normal limits  URINE CULTURE  RPR  HIV ANTIBODY (ROUTINE TESTING W REFLEX)  POCT URINE PREGNANCY  CERVICOVAGINAL ANCILLARY ONLY    EKG   Radiology No results found.  Procedures Procedures (including critical care time)  Medications Ordered in UC Medications - No data to display  Initial Impression / Assessment and Plan / UC Course  I have reviewed the triage vital signs and the nursing notes.  Pertinent labs & imaging results that were available during my care of the patient were reviewed by me and considered in my medical decision making (see chart for details).     Reviewed exam and symptoms with patient.  Urine shows small leuks, will send urine culture and treat for UTI with Macrobid .  Vaginal swab/STD testing as ordered and will contact for any positive results.  Encouraged rest fluids and PCP or GYN follow-up if symptoms do not improve.  ER precautions reviewed. Final Clinical Impressions(s) / UC Diagnoses   Final diagnoses:  Vaginal discharge  Acute cystitis without hematuria     Discharge Instructions      The clinic will contact you with results of the vaginal swab/STD testing as well as urine culture done today if positive.  Start Macrobid  twice daily for 7 days.  Lots of rest and fluids.  Please follow-up with your PCP or gynecologist if your symptoms do not improve.  Please go to the ER for any worsening symptoms.  I hope you feel better soon!     ED Prescriptions     Medication Sig Dispense Auth. Provider   nitrofurantoin ,  macrocrystal-monohydrate, (MACROBID ) 100 MG capsule Take 1 capsule (100 mg total) by mouth 2 (two) times daily  for 7 days. 14 capsule Tereso Unangst, Jodi R, NP      PDMP not reviewed this encounter.   Loreda Myla SAUNDERS, NP 03/05/24 1311

## 2024-03-06 LAB — RPR: RPR Ser Ql: NONREACTIVE

## 2024-03-07 LAB — URINE CULTURE: Culture: 20000 — AB

## 2024-03-08 ENCOUNTER — Ambulatory Visit (HOSPITAL_COMMUNITY): Payer: Self-pay

## 2024-03-08 LAB — CERVICOVAGINAL ANCILLARY ONLY
Bacterial Vaginitis (gardnerella): POSITIVE — AB
Candida Glabrata: NEGATIVE
Candida Vaginitis: POSITIVE — AB
Chlamydia: NEGATIVE
Comment: NEGATIVE
Comment: NEGATIVE
Comment: NEGATIVE
Comment: NEGATIVE
Comment: NEGATIVE
Comment: NORMAL
Neisseria Gonorrhea: NEGATIVE
Trichomonas: NEGATIVE

## 2024-03-08 MED ORDER — FLUCONAZOLE 150 MG PO TABS: 150.0000 mg | ORAL_TABLET | Freq: Once | ORAL | 0 refills | Status: AC

## 2024-03-08 MED ORDER — METRONIDAZOLE 500 MG PO TABS
500.0000 mg | ORAL_TABLET | Freq: Two times a day (BID) | ORAL | 0 refills | Status: AC
Start: 2024-03-08 — End: 2024-03-15

## 2024-03-29 ENCOUNTER — Telehealth (HOSPITAL_COMMUNITY): Payer: Self-pay | Admitting: *Deleted

## 2024-03-29 NOTE — Telephone Encounter (Signed)
 Pt states she did not get the Macrobid  or diflucan  from the phartamcy only med she picked up was flagyl . I called pharmacy they states pr picked up both antibiotics Macrobid  and flagyl  she did not pick up diflucan  but they will get it ready for pick up now.    Called pt and made her aware of what pharmacy states. She still says she did not pick up Macrobid . I have advised since pharmacy states she picked up med we can not resend she will need to come in to be seen again pt verbalized understanding.
